# Patient Record
Sex: Female | Born: 1960 | Race: Black or African American | Hispanic: No | Smoking: Never smoker
Health system: Southern US, Community
[De-identification: ages and names within clinical notes are randomized; demographics above are authoritative.]

## PROBLEM LIST (undated history)

## (undated) DIAGNOSIS — K219 Gastro-esophageal reflux disease without esophagitis: Secondary | ICD-10-CM

## (undated) DIAGNOSIS — G4730 Sleep apnea, unspecified: Secondary | ICD-10-CM

## (undated) DIAGNOSIS — F32A Depression, unspecified: Secondary | ICD-10-CM

## (undated) DIAGNOSIS — L02215 Cutaneous abscess of perineum: Secondary | ICD-10-CM

## (undated) DIAGNOSIS — Z5189 Encounter for other specified aftercare: Secondary | ICD-10-CM

## (undated) DIAGNOSIS — M1990 Unspecified osteoarthritis, unspecified site: Secondary | ICD-10-CM

## (undated) DIAGNOSIS — D649 Anemia, unspecified: Secondary | ICD-10-CM

## (undated) DIAGNOSIS — R011 Cardiac murmur, unspecified: Secondary | ICD-10-CM

## (undated) DIAGNOSIS — IMO0001 Reserved for inherently not codable concepts without codable children: Secondary | ICD-10-CM

## (undated) DIAGNOSIS — E669 Obesity, unspecified: Secondary | ICD-10-CM

## (undated) DIAGNOSIS — F329 Major depressive disorder, single episode, unspecified: Secondary | ICD-10-CM

## (undated) HISTORY — DX: Cutaneous abscess of perineum: L02.215

## (undated) HISTORY — PX: TUBAL LIGATION: SHX77

## (undated) HISTORY — PX: OTHER SURGICAL HISTORY: SHX169

## (undated) HISTORY — DX: Obesity, unspecified: E66.9

## (undated) HISTORY — PX: ACHILLES TENDON SURGERY: SHX542

## (undated) HISTORY — PX: DILATION AND CURETTAGE OF UTERUS: SHX78

## (undated) HISTORY — DX: Anemia, unspecified: D64.9

## (undated) HISTORY — DX: Unspecified osteoarthritis, unspecified site: M19.90

---

## 2001-03-28 HISTORY — PX: GASTRIC BYPASS: SHX52

## 2005-11-05 ENCOUNTER — Emergency Department (HOSPITAL_COMMUNITY): Admission: EM | Admit: 2005-11-05 | Discharge: 2005-11-05 | Payer: Self-pay | Admitting: Family Medicine

## 2006-05-18 ENCOUNTER — Ambulatory Visit (HOSPITAL_COMMUNITY): Admission: RE | Admit: 2006-05-18 | Discharge: 2006-05-18 | Payer: Self-pay | Admitting: Family Medicine

## 2007-06-11 ENCOUNTER — Ambulatory Visit (HOSPITAL_COMMUNITY): Admission: RE | Admit: 2007-06-11 | Discharge: 2007-06-11 | Payer: Self-pay | Admitting: Obstetrics & Gynecology

## 2008-06-13 ENCOUNTER — Ambulatory Visit (HOSPITAL_COMMUNITY): Admission: RE | Admit: 2008-06-13 | Discharge: 2008-06-13 | Payer: Self-pay | Admitting: Obstetrics & Gynecology

## 2008-07-09 ENCOUNTER — Other Ambulatory Visit: Admission: RE | Admit: 2008-07-09 | Discharge: 2008-07-09 | Payer: Self-pay | Admitting: Family Medicine

## 2009-04-15 ENCOUNTER — Emergency Department (HOSPITAL_COMMUNITY): Admission: EM | Admit: 2009-04-15 | Discharge: 2009-04-15 | Payer: Self-pay | Admitting: Emergency Medicine

## 2009-04-16 ENCOUNTER — Ambulatory Visit (HOSPITAL_COMMUNITY): Admission: RE | Admit: 2009-04-16 | Discharge: 2009-04-16 | Payer: Self-pay | Admitting: Emergency Medicine

## 2009-06-16 ENCOUNTER — Ambulatory Visit (HOSPITAL_COMMUNITY): Admission: RE | Admit: 2009-06-16 | Discharge: 2009-06-16 | Payer: Self-pay | Admitting: Family Medicine

## 2010-05-13 ENCOUNTER — Other Ambulatory Visit: Payer: Self-pay | Admitting: Obstetrics & Gynecology

## 2010-05-13 DIAGNOSIS — Z1231 Encounter for screening mammogram for malignant neoplasm of breast: Secondary | ICD-10-CM

## 2010-05-21 ENCOUNTER — Encounter (HOSPITAL_COMMUNITY)
Admission: RE | Admit: 2010-05-21 | Discharge: 2010-05-21 | Disposition: A | Discharge status: Home / Self Care | Payer: BC Managed Care – PPO | Source: Ambulatory Visit | Attending: Obstetrics & Gynecology | Admitting: Obstetrics & Gynecology

## 2010-05-21 LAB — CBC
HCT: 33.6 % — ABNORMAL LOW (ref 36.0–46.0)
Hemoglobin: 10.4 g/dL — ABNORMAL LOW (ref 12.0–15.0)
MCV: 85.9 fL (ref 78.0–100.0)

## 2010-05-28 ENCOUNTER — Other Ambulatory Visit: Payer: Self-pay | Admitting: Obstetrics & Gynecology

## 2010-05-28 ENCOUNTER — Ambulatory Visit (HOSPITAL_COMMUNITY)
Admission: RE | Admit: 2010-05-28 | Discharge: 2010-05-28 | Disposition: A | Discharge status: Home / Self Care | Payer: BC Managed Care – PPO | Source: Ambulatory Visit | Attending: Obstetrics & Gynecology | Admitting: Obstetrics & Gynecology

## 2010-05-28 DIAGNOSIS — Q5128 Other doubling of uterus, other specified: Secondary | ICD-10-CM | POA: Insufficient documentation

## 2010-05-28 DIAGNOSIS — Z01812 Encounter for preprocedural laboratory examination: Secondary | ICD-10-CM | POA: Insufficient documentation

## 2010-05-28 DIAGNOSIS — N938 Other specified abnormal uterine and vaginal bleeding: Secondary | ICD-10-CM | POA: Insufficient documentation

## 2010-05-28 DIAGNOSIS — Z01818 Encounter for other preprocedural examination: Secondary | ICD-10-CM | POA: Insufficient documentation

## 2010-05-28 DIAGNOSIS — N949 Unspecified condition associated with female genital organs and menstrual cycle: Secondary | ICD-10-CM | POA: Insufficient documentation

## 2010-06-01 NOTE — Op Note (Addendum)
NAMEKRISTIANNE, Erika Bennett                  ACCOUNT NO.:  [id number]  MEDICAL RECORD NO.:  [id number]           PATIENT TYPE:  O  LOCATION:  [facility]                          FACILITY:  [facility]  PHYSICIAN:  Roseanna Rainbow, M.D.DATE OF BIRTH:  1960/11/10  DATE OF PROCEDURE:  05/28/2010 DATE OF DISCHARGE:                              OPERATIVE REPORT   PREOPERATIVE DIAGNOSES:  Abnormal uterine bleeding, uterine fibroids.  POSTOPERATIVE DIAGNOSES:  Abnormal uterine bleeding, uterine septum.  PROCEDURES:  Diagnostic hysteroscopy and dilatation and curettage.  SURGEON:  Roseanna Rainbow, MD  ANESTHESIA:  Laryngeal mask airway, paracervical block.  FINDINGS:  At hysteroscopy, the endometrium was somewhat lush appearing. There was a midline broad thick septum extending from the uterine fundus down to the lower uterine segments right above the internal cervical os. All specimens sent to Pathology.  ESTIMATED BLOOD LOSS:  Minimal. COMPLICATIONS:  None.  PROCEDURE:  The patient was taken to the operating room with an IV running.  A laryngeal mask airway was then placed.  She was placed in a semi-lithotomy position in [place] stirrups and prepped and draped in the usual sterile fashion.  After time-out had been completed, a Graves speculum was placed in the patient's vagina.  A paracervical block was then administered using 2% lidocaine along with 6 mL of dilute Pitressin solution.  Please note that, the single-tooth tenaculum had been applied to the anterior lip of the cervix.  The cervix was then dilated with [facility] dilators.  The hysteroscope was then advanced into the uterine cavity with the above findings noted.  A sharp curettage was then performed.  The tenaculum was then removed with minimal bleeding noted from the cervix.  At the close of the procedure, the instrument and pack counts were said to be correct x2.  The patient was taken to the PACU awake and in stable  condition.     Roseanna Rainbow, M.D.     Erika Bennett  D:  05/28/2010  T:  05/29/2010  Job:  [id number]  Electronically Signed by Antionette Char M.D. on 06/01/2010 11:18:07 AM Electronically Signed by Antionette Char M.D. on 06/01/2010 11:25:54 AM Electronically Signed by Antionette Char M.D. on 06/01/2010 11:39:06 AM Electronically Signed by Antionette Char M.D. on 06/01/2010 11:52:29 AM Electronically Signed by Antionette Char M.D. on 06/01/2010 12:07:08 PM Electronically Signed by Antionette Char M.D. on 06/01/2010 12:22:28 PM Electronically Signed by Antionette Char M.D. on 06/01/2010 12:38:30 PM Electronically Signed by Antionette Char M.D. on 06/01/2010 12:56:35 PM Electronically Signed by Antionette Char M.D. on 06/01/2010 01:14:52 PM Electronically Signed by Antionette Char M.D. on 06/01/2010 01:36:45 PM Electronically Signed by Antionette Char M.D. on 06/01/2010 01:56:53 PM Electronically Signed by Antionette Char M.D. on 06/01/2010 02:16:51 PM Electronically Signed by Antionette Char M.D. on 06/01/2010 02:39:01 PM Electronically Signed by Antionette Char M.D. on 06/01/2010 03:02:13 PM Electronically Signed by Antionette Char M.D. on 06/01/2010 03:27:09 PM Electronically Signed by Antionette Char M.D. on 06/01/2010 03:52:49 PM Electronically Signed by Antionette Char M.D. on 06/01/2010 04:00:38 PM Electronically Signed by Antionette Char M.D. on 06/01/2010 04:31:41 PM Electronically Signed by Antionette Char  M.D. on 06/01/2010 05:03:06 PM Electronically Signed by Antionette Char M.D. on 06/01/2010 05:03:06 PM Electronically Signed by Antionette Char M.D. on 06/01/2010 05:29:46 PM Electronically Signed by Antionette Char M.D. on 06/01/2010 05:29:46 PM Electronically Signed by Antionette Char M.D. on 06/01/2010 06:11:58 PM Electronically Signed by Antionette Char M.D. on 06/01/2010 06:20:20  PM Electronically Signed by Antionette Char M.D. on 06/01/2010 07:43:20 PM

## 2010-06-02 ENCOUNTER — Other Ambulatory Visit: Payer: Self-pay | Admitting: Family Medicine

## 2010-06-02 ENCOUNTER — Ambulatory Visit
Admission: RE | Admit: 2010-06-02 | Discharge: 2010-06-02 | Disposition: A | Discharge status: Home / Self Care | Payer: BC Managed Care – PPO | Source: Ambulatory Visit | Attending: Family Medicine | Admitting: Family Medicine

## 2010-06-02 DIAGNOSIS — R0602 Shortness of breath: Secondary | ICD-10-CM

## 2010-06-02 MED ORDER — IOHEXOL 300 MG/ML  SOLN
125.0000 mL | Freq: Once | INTRAMUSCULAR | Status: AC | PRN
  Administered 2010-06-02: 125 mL via INTRAVENOUS

## 2010-06-14 LAB — URINE MICROSCOPIC-ADD ON

## 2010-06-14 LAB — COMPREHENSIVE METABOLIC PANEL
ALT: 12 U/L (ref 0–35)
AST: 15 U/L (ref 0–37)
Albumin: 3.4 g/dL — ABNORMAL LOW (ref 3.5–5.2)
Calcium: 9.2 mg/dL (ref 8.4–10.5)
GFR calc non Af Amer: >60 mL/min (ref >60)
Total Bilirubin: 0.3 mg/dL (ref 0.3–1.2)
Total Protein: 6.8 g/dL (ref 6.0–8.3)

## 2010-06-14 LAB — URINALYSIS, ROUTINE W REFLEX MICROSCOPIC
Bilirubin Urine: NEGATIVE
Glucose, UA: 100 mg/dL — AB
Leukocytes, UA: NEGATIVE
Nitrite: NEGATIVE
Protein, ur: NEGATIVE mg/dL
Urobilinogen, UA: 1 mg/dL (ref 0.0–1.0)
pH: 5.5 (ref 5.0–8.0)

## 2010-06-14 LAB — CBC
MCV: 95 fL (ref 78.0–100.0)
Platelets: 369 10*3/uL (ref 150–400)
RDW: 13.4 % (ref 11.5–15.5)

## 2010-06-14 LAB — DIFFERENTIAL
Eosinophils Relative: 1 % (ref 0–5)
Lymphocytes Relative: 24 % (ref 12–46)
Monocytes Absolute: 1.1 10*3/uL — ABNORMAL HIGH (ref 0.1–1.0)

## 2010-06-14 LAB — POCT PREGNANCY, URINE: Preg Test, Ur: NEGATIVE

## 2010-06-14 LAB — POCT CARDIAC MARKERS: CKMB, poc: <1 ng/mL — ABNORMAL LOW (ref 1.0–8.0)

## 2010-06-18 ENCOUNTER — Ambulatory Visit (HOSPITAL_COMMUNITY)
Admission: RE | Admit: 2010-06-18 | Discharge: 2010-06-18 | Disposition: A | Discharge status: Home / Self Care | Payer: BC Managed Care – PPO | Source: Ambulatory Visit | Attending: Obstetrics & Gynecology | Admitting: Obstetrics & Gynecology

## 2010-06-18 DIAGNOSIS — Z1231 Encounter for screening mammogram for malignant neoplasm of breast: Secondary | ICD-10-CM

## 2010-09-10 ENCOUNTER — Observation Stay (HOSPITAL_COMMUNITY)
Admission: RE | Admit: 2010-09-10 | Discharge: 2010-09-11 | Disposition: A | Discharge status: Home / Self Care | Payer: BC Managed Care – PPO | Source: Ambulatory Visit | Attending: General Surgery | Admitting: General Surgery

## 2010-09-10 DIAGNOSIS — Z01812 Encounter for preprocedural laboratory examination: Secondary | ICD-10-CM | POA: Insufficient documentation

## 2010-09-10 DIAGNOSIS — Z9884 Bariatric surgery status: Secondary | ICD-10-CM | POA: Insufficient documentation

## 2010-09-10 DIAGNOSIS — Z79899 Other long term (current) drug therapy: Secondary | ICD-10-CM | POA: Insufficient documentation

## 2010-09-10 DIAGNOSIS — K612 Anorectal abscess: Principal | ICD-10-CM | POA: Insufficient documentation

## 2010-09-10 LAB — CBC
HCT: 30.6 % — ABNORMAL LOW (ref 36.0–46.0)
Hemoglobin: 10.5 g/dL — ABNORMAL LOW (ref 12.0–15.0)
Hemoglobin: 9.8 g/dL — ABNORMAL LOW (ref 12.0–15.0)
MCH: 26.3 pg (ref 26.0–34.0)
MCHC: 31.8 g/dL (ref 30.0–36.0)
MCV: 82.3 fL (ref 78.0–100.0)
RBC: 3.72 MIL/uL — ABNORMAL LOW (ref 3.87–5.11)
RBC: 3.99 MIL/uL (ref 3.87–5.11)
WBC: 12.9 10*3/uL — ABNORMAL HIGH (ref 4.0–10.5)
WBC: 15.1 10*3/uL — ABNORMAL HIGH (ref 4.0–10.5)

## 2010-09-10 LAB — BASIC METABOLIC PANEL
BUN: 15 mg/dL (ref 6–23)
CO2: 25 mEq/L (ref 19–32)
Calcium: 9.6 mg/dL (ref 8.4–10.5)

## 2010-09-10 LAB — DIFFERENTIAL
Eosinophils Relative: 1 % (ref 0–5)
Lymphocytes Relative: 15 % (ref 12–46)
Lymphs Abs: 1.9 10*3/uL (ref 0.7–4.0)
Neutro Abs: 10 10*3/uL — ABNORMAL HIGH (ref 1.7–7.7)

## 2010-09-10 LAB — SURGICAL PCR SCREEN: Staphylococcus aureus: NEGATIVE

## 2010-09-11 LAB — BASIC METABOLIC PANEL
BUN: 13 mg/dL (ref 6–23)
CO2: 25 mEq/L (ref 19–32)
Chloride: 104 mEq/L (ref 96–112)
Creatinine, Ser: 0.68 mg/dL (ref 0.50–1.10)
Glucose, Bld: 195 mg/dL — ABNORMAL HIGH (ref 70–99)

## 2010-09-12 NOTE — Op Note (Signed)
  NAMEJANECIA, Erika Bennett                  ACCOUNT NO.:  [id number]  MEDICAL RECORD NO.:  [id number]  LOCATION:  1523                         FACILITY:  [facility]  PHYSICIAN:  Angelia Mould. Derrell Lolling, M.D.DATE OF BIRTH:  [birth year]  DATE OF PROCEDURE:  09/10/2010 DATE OF DISCHARGE:                              OPERATIVE REPORT   PREOPERATIVE DIAGNOSIS:  Perirectal abscess, right anterior.  POSTOPERATIVE DIAGNOSIS:  Ischiorectal abscess, right anterior.  OPERATION PERFORMED:  Anoscopy, exam under anesthesia,  incision and drainage of ischiorectal abscess, right anterior.  SURGEON:  Angelia Mould. Derrell Lolling, MD  OPERATIVE INDICATIONS:  This is a 50 year old African American female who presented today with a 1-week history of progressive swelling and pain in the perianal area on the right side.  It has begun to drain. She was seen in the office by Dr. Emelia Loron, but he transferred her to [facility] to my care for incision and drainage ofperirectal abscess under general anesthesia.  On exam, she has a large 4- 5 cm abscess in the right anterior position and she is brought to operating room urgently.  OPERATIVE TECHNIQUE:  Following the induction of general endotracheal anesthesia, the patient was placed in a dorsal lithotomy position in rigid, padded stirrups.  Surgical time-out was held identifying correct patient and correct procedure.  Intravenous antibiotics were given prior to the procedure.  I examined the perianal and vulvar areas.  There was about a 4-5 cm abscess in the perianal area in the right anterior position.  The posterior lateral walls of the vagina did not appear to have any abnormality whatsoever.  Digital rectal exam revealed no stricture. Anoscopy revealed no fistula internally.  I made a radially oriented elliptical incision excising the ellipse of skin and drained the abscess completely.  I digitally explored this to make sure it was completely evacuated.  I  re-inserted the anoscope and looked for a fistula with a probe and could not find any.  Hemostasis was good and achieved with electrocautery.  After irrigating things out, I packed the wound with 1.5-inch gauze and external absorbent bandages.  She tolerated procedure well and was taken to recovery room in stable condition.  Estimated blood loss was about 20 cc.  Complications none. Sponge, needle, and instrument counts were correct.     Angelia Mould. Derrell Lolling, M.D.    HMI/MEDQ  D:  09/10/2010  T:  09/11/2010  Job:  [id number]  Electronically Signed by Claud Kelp M.D. on 09/12/2010 11:41:25 AM

## 2010-09-13 NOTE — H&P (Signed)
  NAMELOREENA, VALERI                  ACCOUNT NO.:  [id number]  MEDICAL RECORD NO.:  [id number]  LOCATION:  [facility]                         FACILITY:  [facility]  PHYSICIAN:  Juanetta Gosling, MDDATE OF BIRTH:  [birth year]  DATE OF ADMISSION:  09/10/2010 DATE OF DISCHARGE:                             HISTORY & PHYSICAL   CHIEF COMPLAINT:  Perrectal pain and drainage.  HISTORY OF PRESENT ILLNESS:  This is a 50 year old female who has perirectal mass and pain for about 1 week.  She had some constipation. Did some straining and she had what appeared to be a hemorrhoid popup following that.  Over the time, just began draining purulent fluid and is worsened over the time.  She has attempted some sitz baths without any real relief of this pain.  She denies any fever.  She finally came in today after been evaluated by gastroenterology office and was sent up here.  PAST MEDICAL HISTORY:  Significant for morbid obesity.  PAST SURGICAL HISTORY:  Significant for gastric bypass and bilateral tubal ligation.  FAMILY HISTORY:  Noncontributory.  SOCIAL HISTORY:  She is nonsmoker and does not smoke.  No alcohol.  ALLERGIES:  She has no known drug allergies.  MEDICATIONS:  Tylenol p.r.n.  REVIEW OF SYSTEMS:  Otherwise negative.  PHYSICAL EXAMINATION:  VITAL SIGNS:  Temperature 97.8, heart rate 78, blood pressure 142/84, weight 220 pounds, height 5 feet 5 inch. GENERAL:  She is a morbidly obese, in no distress. NECK:  Supple. HEART:  Regular rate and rhythm. LUNGS:  Clear bilaterally. ABDOMEN:  Soft and obese. RECTAL:  Examination is difficult due to her body habitus.  She has a right anterior fluctuant perirectal abscess that is draining purulent fluid.  ASSESSMENT:  Perirectal abscess.  PLAN:  Incision and drainage in the Operating Room.  I can adequately do this in the office today.  I have discussed with Dr. Claud Kelp who will plan on taking care of her in the Operating  Room.     Juanetta Gosling, MD     MCW/MEDQ  D:  09/10/2010  T:  09/10/2010  Job:  [id number]  Electronically Signed by Emelia Loron MD on 09/13/2010 04:22:05 PM

## 2010-09-23 NOTE — Discharge Summary (Signed)
  Erika Bennett, Erika Bennett                  ACCOUNT NO.:  [id number]  MEDICAL RECORD NO.:  [id number]  LOCATION:  1523                         FACILITY:  [facility]  PHYSICIAN:  Angelia Mould. Derrell Lolling, M.D.DATE OF BIRTH:  [birth year]  DATE OF ADMISSION:  09/10/2010 DATE OF DISCHARGE:  09/11/2010                              DISCHARGE SUMMARY   FINAL DIAGNOSES: 1. Ischiorectal abscess, right anterior 2. Morbid obesity. 3. Status post Roux-en-Y gastric bypass. 4. Status post bilateral tubal ligation.  OPERATIONS PERFORMED:  Anoscopy, incision and drainage of ischiorectal abscess, right anterior; date of surgery September 10, 2010.  HISTORY:  This is a 50 year old African-American female who presented to the urgent office with a 1-week history of progressive swelling and pain in the perianal area on the right side.  It had begun to drain.  She was evaluated by Dr. Emelia Loron and transferred to [facility] to my care.  Upon admission to the hospital and exam, she was found to have at least a 4 cm to 5 cm abscess in the right anterior position and she was advised to be admitted to the hospital and taken to operating room for drainage.  She completely agreed with this.  HOSPITAL COURSE:  On the day of admission, the patient was taken to operating room.  Under general anesthesia in lithotomy position, she underwent incision and drainage of perirectal abscess in the right anterior position.  This was completely drained and the wound left open. I could not find a fistula.  Postoperatively, the patient did very well.  The following morning, September 11, 2010, she was much more comfortable, had much less pain, did not have any significant bleeding, was up to the bathroom, able to void tolerating the diet, afebrile with a heart rate 77.  The wound looked fine.  There was no active bleeding.  She was discharged on September 11, 2010.  She was given a prescription for Augmentin 875 mg p.o. b.i.d. x7 days  and Vicodin for pain.  She was asked to return to see me in the office in 3 to 4 weeks.  Wound care was discussed and written.     Angelia Mould. Derrell Lolling, M.D.     HMI/MEDQ  D:  09/22/2010  T:  09/22/2010  Job:  [id number]  cc:   Angelia Mould. Derrell Lolling, M.D. [address] [place] [address]  Electronically Signed by Claud Kelp M.D. on 09/23/2010 02:16:17 PM

## 2010-10-04 ENCOUNTER — Encounter (INDEPENDENT_AMBULATORY_CARE_PROVIDER_SITE_OTHER): Payer: Self-pay | Admitting: General Surgery

## 2010-10-11 ENCOUNTER — Encounter (INDEPENDENT_AMBULATORY_CARE_PROVIDER_SITE_OTHER): Payer: Self-pay | Admitting: General Surgery

## 2010-10-11 ENCOUNTER — Ambulatory Visit (INDEPENDENT_AMBULATORY_CARE_PROVIDER_SITE_OTHER): Payer: BC Managed Care – PPO | Admitting: General Surgery

## 2010-10-11 DIAGNOSIS — Z09 Encounter for follow-up examination after completed treatment for conditions other than malignant neoplasm: Secondary | ICD-10-CM

## 2010-10-11 NOTE — Patient Instructions (Signed)
Your rectal incision appears to be healing very nicely. Continue to keep the skin clean. Continue to use baby wipes. Avoid constipation and take daily stool softeners if necessary. I expect that the wound should completely heal within one month and that there should be no more pain or drainage at that point. Please call me if there are any problems after that time.

## 2010-10-11 NOTE — Progress Notes (Signed)
Subjective:     Patient ID: Erika Bennett, female   DOB: Aug 01, 1960, 50 y.o.   MRN: [id number]  HPI Patient underwent incision and drainage of perirectal abscess September 10, 2010. This was a fairly large abscess in the right anterior position. She has done well since discharge. She still has a little bit of drainage and is still wearing a pad she sees no blood and has almost no leading. She's having normal bowel movements, is not constipated.  Review of Systems     Objective:   Physical Exam The perianal tissues looked very good. The wound has almost completely healed and only is a small open slit of tissue. There is no purulence. There is no cellulitis. It is minimally tender.    Assessment:     Perirectal abscess,status post incision and drainage, healing rapidly and uneventfully.    Plan:     Continue wound care and perianal hygiene as instructed. Return to see me if it has not completely healed in one month. Otherwise see me p.r.n.

## 2010-10-28 ENCOUNTER — Other Ambulatory Visit: Payer: Self-pay | Admitting: Obstetrics & Gynecology

## 2010-10-28 DIAGNOSIS — Z30431 Encounter for routine checking of intrauterine contraceptive device: Secondary | ICD-10-CM

## 2010-10-28 DIAGNOSIS — Q649 Congenital malformation of urinary system, unspecified: Secondary | ICD-10-CM

## 2010-11-02 ENCOUNTER — Ambulatory Visit (HOSPITAL_COMMUNITY): Payer: BC Managed Care – PPO

## 2010-11-03 ENCOUNTER — Ambulatory Visit (HOSPITAL_COMMUNITY)
Admission: RE | Admit: 2010-11-03 | Discharge: 2010-11-03 | Disposition: A | Discharge status: Home / Self Care | Payer: BC Managed Care – PPO | Source: Ambulatory Visit | Attending: Obstetrics & Gynecology | Admitting: Obstetrics & Gynecology

## 2010-11-03 DIAGNOSIS — Q649 Congenital malformation of urinary system, unspecified: Secondary | ICD-10-CM

## 2010-11-03 DIAGNOSIS — Q519 Congenital malformation of uterus and cervix, unspecified: Secondary | ICD-10-CM | POA: Insufficient documentation

## 2010-11-03 DIAGNOSIS — Q513 Bicornate uterus: Secondary | ICD-10-CM | POA: Insufficient documentation

## 2010-11-03 MED ORDER — IOHEXOL 350 MG/ML SOLN
100.0000 mL | Freq: Once | INTRAVENOUS | Status: AC | PRN
  Administered 2010-11-03: 100 mL via INTRAVENOUS

## 2011-05-18 ENCOUNTER — Other Ambulatory Visit: Payer: Self-pay | Admitting: Obstetrics & Gynecology

## 2011-05-19 ENCOUNTER — Other Ambulatory Visit: Payer: Self-pay | Admitting: Obstetrics & Gynecology

## 2011-05-19 DIAGNOSIS — N898 Other specified noninflammatory disorders of vagina: Secondary | ICD-10-CM

## 2011-05-24 ENCOUNTER — Ambulatory Visit (HOSPITAL_COMMUNITY)
Admission: RE | Admit: 2011-05-24 | Discharge: 2011-05-24 | Disposition: A | Discharge status: Home / Self Care | Payer: BC Managed Care – PPO | Source: Ambulatory Visit | Attending: Obstetrics & Gynecology | Admitting: Obstetrics & Gynecology

## 2011-05-24 DIAGNOSIS — N926 Irregular menstruation, unspecified: Secondary | ICD-10-CM | POA: Insufficient documentation

## 2011-05-24 DIAGNOSIS — D250 Submucous leiomyoma of uterus: Secondary | ICD-10-CM | POA: Insufficient documentation

## 2011-05-24 DIAGNOSIS — N898 Other specified noninflammatory disorders of vagina: Secondary | ICD-10-CM | POA: Insufficient documentation

## 2011-06-05 ENCOUNTER — Inpatient Hospital Stay (HOSPITAL_COMMUNITY)
Admission: AD | Admit: 2011-06-05 | Discharge: 2011-06-05 | Disposition: A | Discharge status: Home / Self Care | Payer: BC Managed Care – PPO | Source: Ambulatory Visit | Attending: Obstetrics & Gynecology | Admitting: Obstetrics & Gynecology

## 2011-06-05 ENCOUNTER — Encounter (HOSPITAL_COMMUNITY): Payer: Self-pay | Admitting: Obstetrics and Gynecology

## 2011-06-05 DIAGNOSIS — N949 Unspecified condition associated with female genital organs and menstrual cycle: Secondary | ICD-10-CM | POA: Insufficient documentation

## 2011-06-05 DIAGNOSIS — N939 Abnormal uterine and vaginal bleeding, unspecified: Secondary | ICD-10-CM

## 2011-06-05 DIAGNOSIS — N938 Other specified abnormal uterine and vaginal bleeding: Secondary | ICD-10-CM | POA: Insufficient documentation

## 2011-06-05 DIAGNOSIS — N898 Other specified noninflammatory disorders of vagina: Secondary | ICD-10-CM

## 2011-06-05 LAB — CBC
HCT: 35.1 % — ABNORMAL LOW (ref 36.0–46.0)
Hemoglobin: 11.3 g/dL — ABNORMAL LOW (ref 12.0–15.0)
MCH: 29.2 pg (ref 26.0–34.0)
MCHC: 32.2 g/dL (ref 30.0–36.0)
MCV: 90.7 fL (ref 78.0–100.0)
Platelets: 319 10*3/uL (ref 150–400)
RBC: 3.87 MIL/uL (ref 3.87–5.11)
RDW: 20.8 % — ABNORMAL HIGH (ref 11.5–15.5)
WBC: 10.6 10*3/uL — ABNORMAL HIGH (ref 4.0–10.5)

## 2011-06-05 LAB — SAMPLE TO BLOOD BANK

## 2011-06-05 MED ORDER — NAPROXEN SODIUM 550 MG PO TABS: 550.0000 mg | ORAL_TABLET | Freq: Two times a day (BID) | ORAL | Status: DC

## 2011-06-05 MED ORDER — KETOROLAC TROMETHAMINE 30 MG/ML IJ SOLN
30.0000 mg | Freq: Once | INTRAMUSCULAR | Status: DC
Start: 2011-06-05 — End: 2011-06-05

## 2011-06-05 MED ORDER — MEGESTROL ACETATE 40 MG PO TABS: ORAL_TABLET | ORAL | Status: DC

## 2011-06-05 NOTE — Discharge Instructions (Signed)

## 2011-06-05 NOTE — ED Provider Notes (Signed)
History     CSN: [id number]  Arrival date & time 06/05/11  1513   None     Chief Complaint  Patient presents with  . Vaginal Bleeding    HPI Erika Bennett is a 51 y.o. G3P3, LMP 2/24 who presets with heavy bleeding with clots and cramping. She has long hx abnormal bleeding, has had D&C, ablation, OCP's that have not been effective. She placed Nuvaring 3/7, bleeding finally stopped, she had severe cramping so removed it 1 day later, bleeding started again 3/9. She had U/S 2/28- 1.8 cm fundal fibroid, .9 left anterior fibroid, both with submucosal component. Her bleeding became heavy at 3 am, she is changing a tampon + pad every 1 1/2-2 hrs, passing 2-3 cm clots and having severe cramping.  Last OV with Dr Tamela Oddi was last week, no f/u appt yet.  Past Medical History  Diagnosis Date  . Obesity   . Arthritis   . Anemia   . Abscess of perineum     Past Surgical History  Procedure Date  . Gastric bypass   . Tubal ligation   . Peri rectal   . Abscess peri rectal     I&d  . Dilation and curettage of uterus     Family History  Problem Relation Age of Onset  . Heart disease Father     heart attacks  . Cancer Brother     prostate    History  Substance Use Topics  . Smoking status: Never Smoker   . Smokeless tobacco: Not on file  . Alcohol Use: No    OB History    Grav Para Term Preterm Abortions TAB SAB Ect Mult Living   3 3        3       Review of Systems  Constitutional: Positive for fatigue. Negative for fever and chills.  Genitourinary: Positive for vaginal bleeding. Negative for dysuria, urgency, frequency, vaginal discharge and vaginal pain.  Neurological: Negative for dizziness, syncope and weakness.    Allergies  Review of patient's allergies indicates no known allergies.  Home Medications  No current outpatient prescriptions on file.  BP 149/59  Pulse 74  Temp(Src) 99.9 F (37.7 C) (Oral)  Resp 18  Ht 5\' 4"  (1.626 m)  Wt 318 lb 3.2 oz  (144.335 kg)  BMI 54.62 kg/m2  LMP 05/22/2011  Physical Exam  Constitutional: She appears well-developed and well-nourished.  Abdominal: Soft. There is no tenderness. There is no rebound and no guarding.       obese  Genitourinary:       External gen- nl Vagina- small amt bright red blood, no clots, no cyst seen or palpated cx-parous  UT- sl enlarged Adn- no masses spalp, non tender  Musculoskeletal: Normal range of motion.  Neurological: She is alert.  Skin: Skin is warm and dry.  Psychiatric: She has a normal mood and affect. Her behavior is normal.    ED Course  Procedures (including critical care time)   Labs Reviewed  URINALYSIS, ROUTINE W REFLEX MICROSCOPIC   Results for orders placed during the hospital encounter of 06/05/11 (from the past 24 hour(s))  CBC     Status: Abnormal   Collection Time   06/05/11  4:40 PM      Component Value Range   WBC 10.6 (*) 4.0 - 10.5 (K/uL)   RBC 3.87  3.87 - 5.11 (MIL/uL)   Hemoglobin 11.3 (*) 12.0 - 15.0 (g/dL)   HCT 08.6 (*) 57.8 -  46.0 (%)   MCV 90.7  78.0 - 100.0 (fL)   MCH 29.2  26.0 - 34.0 (pg)   MCHC 32.2  30.0 - 36.0 (g/dL)   RDW 16.1 (*) 09.6 - 15.5 (%)   Platelets 319  150 - 400 (K/uL)   ASSESSMENT:  Persistent vaginal bleeding  ASSESSMENT:  Megace 40 mg TID x 3 days, BID x 3 days then 1/day Pt to call the office and make an appt with Dr Tamela Oddi for f/u      MDM          Rodell Perna, NP 06/05/11 1734

## 2011-06-05 NOTE — Progress Notes (Signed)
Pt had u/s last week to evaluate a ovarian cyst. Had heavy bleeding with last period that started 2/24. Bleeding stopped on 3/7 and restarted on 3/9 and is heavy again passing large clots.

## 2011-06-08 ENCOUNTER — Other Ambulatory Visit: Payer: Self-pay | Admitting: Obstetrics & Gynecology

## 2011-07-04 ENCOUNTER — Other Ambulatory Visit: Payer: Self-pay | Admitting: Obstetrics & Gynecology

## 2011-07-04 DIAGNOSIS — Z1231 Encounter for screening mammogram for malignant neoplasm of breast: Secondary | ICD-10-CM

## 2011-07-11 ENCOUNTER — Encounter (HOSPITAL_COMMUNITY): Payer: Self-pay | Admitting: Pharmacy Technician

## 2011-07-12 ENCOUNTER — Encounter (HOSPITAL_COMMUNITY): Payer: Self-pay

## 2011-07-12 ENCOUNTER — Encounter (HOSPITAL_COMMUNITY)
Admission: RE | Admit: 2011-07-12 | Discharge: 2011-07-12 | Disposition: A | Discharge status: Home / Self Care | Payer: BC Managed Care – PPO | Source: Ambulatory Visit | Attending: Obstetrics & Gynecology | Admitting: Obstetrics & Gynecology

## 2011-07-12 HISTORY — DX: Encounter for other specified aftercare: Z51.89

## 2011-07-12 HISTORY — DX: Depression, unspecified: F32.A

## 2011-07-12 HISTORY — DX: Sleep apnea, unspecified: G47.30

## 2011-07-12 HISTORY — DX: Cardiac murmur, unspecified: R01.1

## 2011-07-12 HISTORY — DX: Reserved for inherently not codable concepts without codable children: IMO0001

## 2011-07-12 HISTORY — DX: Gastro-esophageal reflux disease without esophagitis: K21.9

## 2011-07-12 HISTORY — DX: Major depressive disorder, single episode, unspecified: F32.9

## 2011-07-12 LAB — CBC
HCT: 36.1 % (ref 36.0–46.0)
MCH: 29.4 pg (ref 26.0–34.0)
MCV: 90 fL (ref 78.0–100.0)
Platelets: 409 10*3/uL — ABNORMAL HIGH (ref 150–400)
RDW: 14.8 % (ref 11.5–15.5)

## 2011-07-12 LAB — BASIC METABOLIC PANEL
CO2: 26 mEq/L (ref 19–32)
Chloride: 103 mEq/L (ref 96–112)
Creatinine, Ser: 0.76 mg/dL (ref 0.50–1.10)
Glucose, Bld: 92 mg/dL (ref 70–99)

## 2011-07-12 NOTE — Patient Instructions (Signed)
20 Erika Bennett  07/12/2011   Your procedure is scheduled on:  07/22/11  Friday  Surgery [id number]  Report to [facility] at   0515    AM.  Call this number if you have problems the morning of surgery: [phone]     Or PST   [id number]  [facility]             Call office regarding stopping  Vitamins and antiinflammatories  Remember:  Do not eat food  or  Or drink liquids:After Midnight. Thursday NIGHT    Clear liquids include soda, tea, black coffee, apple or grape juice, broth.  Take these medicines the morning of surgery with A SIP OF WATER:none    MAY USE TYLENOL ARTHRITIS AM OF OR   Do not wear jewelry, make-up or nail polish.  Do not wear lotions, powders, or perfumes. You may wear deodorant.  Do not shave 48 hours prior to surgery.  Do not bring valuables to the hospital.  Contacts, dentures or bridgework may not be worn into surgery.  Leave suitcase in the car. After surgery it may be brought to your room.  For patients admitted to the hospital, checkout time is 11:00 AM the day of discharge.   Patients discharged the day of surgery will not be allowed to drive home.  Name and phone number of your driver: Erika Bennett                                                                     Special Instructions: CHG Shower Use Special Wash: 1/2 bottle night before surgery and 1/2 bottle morning of surgery. REGULAR SOAP FACE AND PRIVATES              LADIES- NO SHAVING 48 HOURS BEFORE USING BETASEPT SOAP.                 Please read over the following fact sheets that you were given: MRSA Information

## 2011-07-12 NOTE — Pre-Procedure Instructions (Signed)
At PST visit- order noted from Dr Tamela Oddi that pregnancy test is not needed- pt has had a BTL and is currently bleeding

## 2011-07-12 NOTE — Pre-Procedure Instructions (Signed)
Left voice mail message on nurse line at Dr Lodema Pilot office regarding clarification of ambulatory surgery status vs outpatient in bed day of surgery

## 2011-07-18 ENCOUNTER — Encounter (HOSPITAL_COMMUNITY): Payer: Self-pay | Admitting: Obstetrics & Gynecology

## 2011-07-18 DIAGNOSIS — N926 Irregular menstruation, unspecified: Secondary | ICD-10-CM | POA: Diagnosis present

## 2011-07-18 NOTE — H&P (Signed)
  Subjective:  Erika Bennett is a 51 y.o. gravida 3 para 3, female.  The patient has a long h/o abnormal uterine bleeding.  Onset of symptoms was gradual starting several years ago with gradually worsening course since that time. Bleeding is characterized as heavy.   Evaluation to date: D & C: showed a uterine septum, hysteroscopy: see above and pelvic ultrasound: positive for uterine fibroids. Treatment to date: endometrial ablation.  Pertinent Gyn History: See above Menses heavy/irregular Bleeding: see above Contraception: tubal ligation Preventive screening:  Last pap: abnormal: LSIL; colposcopy negative Date: 1/13  Patient Active Problem List  Diagnoses Date Noted  . Irregular menstrual cycle 07/18/2011   Past Medical History  Diagnosis Date  . Obesity   . Arthritis   . Abscess of perineum   . Heart murmur   . Anemia     states bleeding vaginally since 2/13  . Blood transfusion     age 58 months  . GERD (gastroesophageal reflux disease)   . Sleep apnea     last used CPAP 09-18-02  . Depression     controlled/ states death of husband 17-Sep-2009    Past Surgical History  Procedure Date  . Tubal ligation   . Peri rectal   . Abscess peri rectal     I&d  . Dilation and curettage of uterus   . Gastric bypass 09-17-01    No prescriptions prior to admission   No Known Allergies  History  Substance Use Topics  . Smoking status: Never Smoker   . Smokeless tobacco: Never Used  . Alcohol Use: No     2-3 drinks week    Family History  Problem Relation Age of Onset  . Heart disease Father     heart attacks  . Cancer Brother     prostate     Review of Systems Pertinent items are noted in HPI.    Objective:   Vital signs in last 24 hours:    General:   alert  Skin:   normal  HEENT:  PERRLA  Lungs:   clear to auscultation bilaterally  Heart:   regular rate and rhythm, S1, S2 normal, no murmur, click, rub or gallop  Breasts:   normal without suspicious masses, skin or nipple  changes or axillary nodes  Abdomen:  soft, non-tender; bowel sounds normal; no masses,  no organomegaly  Pelvis:  External genitalia: normal general appearance Vaginal: normal mucosa without prolapse or lesions Cervix: normal appearance Adnexa: normal bimanual exam Uterus: enlarged                                     Assessment/Plan:  Perimenopausal with continued, irregular bleeding. Small fibroid uterus.    I had a lengthy discussion with the patient regarding her bleeding and consideration for a robotic-assis hysterectomy.  Procedure, risks, reasons, benefits and complications (including injury to bowel, bladder, major blood vessel, ureter, bleeding, possibility of transfusion, infection, or fistula formation) were reviewed in detail. Consent was signed and preop testing was ordered.  Instructions were reviewed, including NPO after midnight.

## 2011-07-21 NOTE — Anesthesia Preprocedure Evaluation (Addendum)
Anesthesia Evaluation  Patient identified by MRN, date of birth, ID band Patient awake    Reviewed: Allergy & Precautions, H&P , NPO status , Patient's Chart, lab work & pertinent test results  Airway Mallampati: III TM Distance: >3 FB Neck ROM: full    Dental No notable dental hx. (+) Teeth Intact and Dental Advisory Given   Pulmonary neg pulmonary ROS, sleep apnea ,  Last used CPAP 2004 breath sounds clear to auscultation  Pulmonary exam normal       Cardiovascular Exercise Tolerance: Good negative cardio ROS  Rhythm:regular Rate:Normal + Systolic murmurs    Neuro/Psych negative neurological ROS  negative psych ROS   GI/Hepatic negative GI ROS, Neg liver ROS, GERD-  ,  Endo/Other  negative endocrine ROSMorbid obesity  Renal/GU negative Renal ROS  negative genitourinary   Musculoskeletal   Abdominal   Peds  Hematology negative hematology ROS (+)   Anesthesia Other Findings   Reproductive/Obstetrics negative OB ROS                          Anesthesia Physical Anesthesia Plan  ASA: II  Anesthesia Plan: General   Post-op Pain Management:    Induction: Intravenous  Airway Management Planned: Oral ETT  Additional Equipment:   Intra-op Plan:   Post-operative Plan: Extubation in OR  Informed Consent: I have reviewed the patients History and Physical, chart, labs and discussed the procedure including the risks, benefits and alternatives for the proposed anesthesia with the patient or authorized representative who has indicated his/her understanding and acceptance.   Dental Advisory Given  Plan Discussed with: CRNA and Surgeon  Anesthesia Plan Comments:         Anesthesia Quick Evaluation

## 2011-07-22 ENCOUNTER — Ambulatory Visit (HOSPITAL_COMMUNITY): Payer: BC Managed Care – PPO | Admitting: Anesthesiology

## 2011-07-22 ENCOUNTER — Encounter (HOSPITAL_COMMUNITY): Payer: Self-pay | Admitting: *Deleted

## 2011-07-22 ENCOUNTER — Encounter (HOSPITAL_COMMUNITY): Payer: Self-pay | Admitting: Anesthesiology

## 2011-07-22 ENCOUNTER — Inpatient Hospital Stay (HOSPITAL_COMMUNITY)
Admission: RE | Admit: 2011-07-22 | Discharge: 2011-07-25 | DRG: 573 | Disposition: A | Discharge status: Home / Self Care | Payer: BC Managed Care – PPO | Source: Ambulatory Visit | Attending: Obstetrics & Gynecology | Admitting: Obstetrics & Gynecology

## 2011-07-22 ENCOUNTER — Encounter (HOSPITAL_COMMUNITY)
Admission: RE | Disposition: A | Discharge status: Home / Self Care | Payer: Self-pay | Source: Ambulatory Visit | Attending: Obstetrics & Gynecology

## 2011-07-22 DIAGNOSIS — IMO0002 Reserved for concepts with insufficient information to code with codable children: Secondary | ICD-10-CM | POA: Diagnosis not present

## 2011-07-22 DIAGNOSIS — M129 Arthropathy, unspecified: Secondary | ICD-10-CM | POA: Diagnosis present

## 2011-07-22 DIAGNOSIS — Y836 Removal of other organ (partial) (total) as the cause of abnormal reaction of the patient, or of later complication, without mention of misadventure at the time of the procedure: Secondary | ICD-10-CM | POA: Diagnosis not present

## 2011-07-22 DIAGNOSIS — K219 Gastro-esophageal reflux disease without esophagitis: Secondary | ICD-10-CM | POA: Diagnosis present

## 2011-07-22 DIAGNOSIS — Z5331 Laparoscopic surgical procedure converted to open procedure: Secondary | ICD-10-CM

## 2011-07-22 DIAGNOSIS — Z01812 Encounter for preprocedural laboratory examination: Secondary | ICD-10-CM

## 2011-07-22 DIAGNOSIS — D259 Leiomyoma of uterus, unspecified: Principal | ICD-10-CM | POA: Diagnosis present

## 2011-07-22 DIAGNOSIS — N926 Irregular menstruation, unspecified: Secondary | ICD-10-CM | POA: Diagnosis present

## 2011-07-22 HISTORY — PX: ABDOMINAL HYSTERECTOMY: SHX81

## 2011-07-22 LAB — CBC
Platelets: 290 10*3/uL (ref 150–400)
RBC: 3.43 MIL/uL — ABNORMAL LOW (ref 3.87–5.11)
RDW: 15 % (ref 11.5–15.5)
WBC: 13.6 10*3/uL — ABNORMAL HIGH (ref 4.0–10.5)

## 2011-07-22 LAB — BASIC METABOLIC PANEL
CO2: 24 mEq/L (ref 19–32)
Chloride: 107 mEq/L (ref 96–112)
GFR calc Af Amer: >90 mL/min (ref >90)
Sodium: 137 mEq/L (ref 135–145)

## 2011-07-22 LAB — ABO/RH: ABO/RH(D): O POS

## 2011-07-22 SURGERY — ROBOTIC ASSISTED TOTAL HYSTERECTOMY
Anesthesia: General | Wound class: Clean

## 2011-07-22 MED ORDER — HETASTARCH-ELECTROLYTES 6 % IV SOLN
INTRAVENOUS | Status: DC | PRN
  Administered 2011-07-22: 11:00:00 via INTRAVENOUS

## 2011-07-22 MED ORDER — CISATRACURIUM BESYLATE 2 MG/ML IV SOLN
INTRAVENOUS | Status: DC | PRN
  Administered 2011-07-22 (×2): 4 mg via INTRAVENOUS
  Administered 2011-07-22: 2 mg via INTRAVENOUS
  Administered 2011-07-22: 6 mg via INTRAVENOUS
  Administered 2011-07-22: 10 mg via INTRAVENOUS

## 2011-07-22 MED ORDER — PROPOFOL 10 MG/ML IV BOLUS
INTRAVENOUS | Status: DC | PRN
  Administered 2011-07-22: 200 mg via INTRAVENOUS

## 2011-07-22 MED ORDER — MAGNESIUM HYDROXIDE 400 MG/5ML PO SUSP
30.0000 mL | Freq: Three times a day (TID) | ORAL | Status: AC
  Administered 2011-07-22 – 2011-07-23 (×3): 30 mL via ORAL
  Filled 2011-07-22 (×3): qty 30

## 2011-07-22 MED ORDER — ONDANSETRON HCL 4 MG PO TABS: 4.0000 mg | ORAL_TABLET | Freq: Four times a day (QID) | ORAL | Status: DC | PRN

## 2011-07-22 MED ORDER — SODIUM CHLORIDE 0.9 % IJ SOLN: 9.0000 mL | INTRAMUSCULAR | Status: DC | PRN

## 2011-07-22 MED ORDER — LACTATED RINGERS IR SOLN
Status: DC | PRN
  Administered 2011-07-22: 100 mL

## 2011-07-22 MED ORDER — HYDROMORPHONE 0.3 MG/ML IV SOLN
INTRAVENOUS | Status: AC
  Filled 2011-07-22: qty 25

## 2011-07-22 MED ORDER — ACETAMINOPHEN 10 MG/ML IV SOLN
INTRAVENOUS | Status: AC
  Filled 2011-07-22: qty 100

## 2011-07-22 MED ORDER — ONDANSETRON HCL 4 MG/2ML IJ SOLN
INTRAMUSCULAR | Status: DC | PRN
  Administered 2011-07-22: 4 mg via INTRAVENOUS

## 2011-07-22 MED ORDER — OXYCODONE-ACETAMINOPHEN 5-325 MG PO TABS
1.0000 | ORAL_TABLET | ORAL | Status: DC | PRN
  Administered 2011-07-24 – 2011-07-25 (×6): 2 via ORAL
  Filled 2011-07-22 (×6): qty 2

## 2011-07-22 MED ORDER — HYDROMORPHONE 0.3 MG/ML IV SOLN
INTRAVENOUS | Status: DC
  Administered 2011-07-22: 0.3 mg via INTRAVENOUS
  Administered 2011-07-22: 1.8 mg via INTRAVENOUS
  Administered 2011-07-22: 1.5 mg via INTRAVENOUS
  Administered 2011-07-23: 2.7 mg via INTRAVENOUS
  Administered 2011-07-23: 2.1 mg via INTRAVENOUS
  Administered 2011-07-23: 13:00:00 via INTRAVENOUS
  Administered 2011-07-23: 1.8 mg via INTRAVENOUS
  Administered 2011-07-23: 3.3 mg via INTRAVENOUS
  Administered 2011-07-23: 02:00:00 via INTRAVENOUS
  Administered 2011-07-23: 4.5 mg via INTRAVENOUS
  Administered 2011-07-24: 0.3 mg via INTRAVENOUS
  Filled 2011-07-22 (×2): qty 25

## 2011-07-22 MED ORDER — LACTATED RINGERS IV SOLN: INTRAVENOUS | Status: DC

## 2011-07-22 MED ORDER — ONDANSETRON HCL 4 MG/2ML IJ SOLN: 4.0000 mg | Freq: Four times a day (QID) | INTRAMUSCULAR | Status: DC | PRN

## 2011-07-22 MED ORDER — BUPIVACAINE HCL (PF) 0.25 % IJ SOLN
INTRAMUSCULAR | Status: AC
  Filled 2011-07-22: qty 30

## 2011-07-22 MED ORDER — NALOXONE HCL 0.4 MG/ML IJ SOLN: 0.4000 mg | INTRAMUSCULAR | Status: DC | PRN

## 2011-07-22 MED ORDER — SODIUM CHLORIDE 0.9 % IV SOLN
INTRAVENOUS | Status: DC | PRN
  Administered 2011-07-22: 12:00:00 via INTRAVENOUS

## 2011-07-22 MED ORDER — GLYCOPYRROLATE 0.2 MG/ML IJ SOLN
INTRAMUSCULAR | Status: DC | PRN
  Administered 2011-07-22: 0.6 mg via INTRAVENOUS

## 2011-07-22 MED ORDER — CEFAZOLIN SODIUM-DEXTROSE 2-3 GM-% IV SOLR
2.0000 g | INTRAVENOUS | Status: AC
  Administered 2011-07-22: 2 g via INTRAVENOUS

## 2011-07-22 MED ORDER — ZOLPIDEM TARTRATE 5 MG PO TABS: 5.0000 mg | ORAL_TABLET | Freq: Every evening | ORAL | Status: DC | PRN

## 2011-07-22 MED ORDER — LIDOCAINE HCL (CARDIAC) 20 MG/ML IV SOLN
INTRAVENOUS | Status: DC | PRN
  Administered 2011-07-22: 50 mg via INTRAVENOUS

## 2011-07-22 MED ORDER — HYDROMORPHONE HCL PF 1 MG/ML IJ SOLN
INTRAMUSCULAR | Status: DC | PRN
  Administered 2011-07-22 (×4): 1 mg via INTRAVENOUS

## 2011-07-22 MED ORDER — SUFENTANIL CITRATE 50 MCG/ML IV SOLN
INTRAVENOUS | Status: DC | PRN
  Administered 2011-07-22: 20 ug via INTRAVENOUS
  Administered 2011-07-22 (×8): 10 ug via INTRAVENOUS

## 2011-07-22 MED ORDER — MAGNESIUM HYDROXIDE 400 MG/5ML PO SUSP: 30.0000 mL | Freq: Three times a day (TID) | ORAL | Status: DC | PRN

## 2011-07-22 MED ORDER — BUPIVACAINE HCL (PF) 0.25 % IJ SOLN
INTRAMUSCULAR | Status: DC | PRN
  Administered 2011-07-22: 10 mL

## 2011-07-22 MED ORDER — STERILE WATER FOR IRRIGATION IR SOLN
Status: DC | PRN
  Administered 2011-07-22: 3000 mL

## 2011-07-22 MED ORDER — MAGNESIUM HYDROXIDE 400 MG/5ML PO SUSP: 30.0000 mL | Freq: Three times a day (TID) | ORAL | Status: DC

## 2011-07-22 MED ORDER — MIDAZOLAM HCL 5 MG/5ML IJ SOLN
INTRAMUSCULAR | Status: DC | PRN
  Administered 2011-07-22: 2 mg via INTRAVENOUS

## 2011-07-22 MED ORDER — BUPIVACAINE LIPOSOME 1.3 % IJ SUSP
20.0000 mL | Freq: Once | INTRAMUSCULAR | Status: DC
  Filled 2011-07-22: qty 20

## 2011-07-22 MED ORDER — 0.9 % SODIUM CHLORIDE (POUR BTL) OPTIME
TOPICAL | Status: DC | PRN
  Administered 2011-07-22: 3000 mL

## 2011-07-22 MED ORDER — NEOSTIGMINE METHYLSULFATE 1 MG/ML IJ SOLN
INTRAMUSCULAR | Status: DC | PRN
  Administered 2011-07-22: 4 mg via INTRAVENOUS

## 2011-07-22 MED ORDER — KCL IN DEXTROSE-NACL 20-5-0.45 MEQ/L-%-% IV SOLN
INTRAVENOUS | Status: DC
  Administered 2011-07-22: 17:00:00 via INTRAVENOUS
  Administered 2011-07-23: 125 mL/h via INTRAVENOUS
  Administered 2011-07-23: 125 mL via INTRAVENOUS
  Administered 2011-07-24: 02:00:00 via INTRAVENOUS
  Filled 2011-07-22 (×10): qty 1000

## 2011-07-22 MED ORDER — SUCCINYLCHOLINE CHLORIDE 20 MG/ML IJ SOLN
INTRAMUSCULAR | Status: DC | PRN
  Administered 2011-07-22: 100 mg via INTRAVENOUS

## 2011-07-22 MED ORDER — CEFAZOLIN SODIUM-DEXTROSE 2-3 GM-% IV SOLR
INTRAVENOUS | Status: AC
  Filled 2011-07-22: qty 50

## 2011-07-22 MED ORDER — ACETAMINOPHEN 10 MG/ML IV SOLN
INTRAVENOUS | Status: DC | PRN
  Administered 2011-07-22: 1000 mg via INTRAVENOUS

## 2011-07-22 MED ORDER — LACTATED RINGERS IV SOLN
INTRAVENOUS | Status: DC | PRN
  Administered 2011-07-22 (×4): via INTRAVENOUS

## 2011-07-22 MED ORDER — DIPHENHYDRAMINE HCL 12.5 MG/5ML PO ELIX
12.5000 mg | ORAL_SOLUTION | Freq: Four times a day (QID) | ORAL | Status: DC | PRN
  Filled 2011-07-22: qty 5

## 2011-07-22 MED ORDER — HYDROMORPHONE HCL PF 1 MG/ML IJ SOLN: 0.2500 mg | INTRAMUSCULAR | Status: DC | PRN

## 2011-07-22 MED ORDER — DIPHENHYDRAMINE HCL 50 MG/ML IJ SOLN: 12.5000 mg | Freq: Four times a day (QID) | INTRAMUSCULAR | Status: DC | PRN

## 2011-07-22 SURGICAL SUPPLY — 81 items
ADH SKN CLS APL DERMABOND .7 (GAUZE/BANDAGES/DRESSINGS) ×2
APL SKNCLS STERI-STRIP NONHPOA (GAUZE/BANDAGES/DRESSINGS) ×2
BAG URINE DRAINAGE (UROLOGICAL SUPPLIES) ×2 IMPLANT
BARRIER ADHS 3X4 INTERCEED (GAUZE/BANDAGES/DRESSINGS) ×3 IMPLANT
BENZOIN TINCTURE PRP APPL 2/3 (GAUZE/BANDAGES/DRESSINGS) ×3 IMPLANT
BLADELESS LONG 8MM (BLADE) ×2 IMPLANT
BRR ADH 4X3 ABS CNTRL BYND (GAUZE/BANDAGES/DRESSINGS) ×2
CABLE HIGH FREQUENCY MONO STRZ (ELECTRODE) ×3 IMPLANT
CATH FOLEY 3WAY  5CC 16FR (CATHETERS)
CATH FOLEY 3WAY 5CC 16FR (CATHETERS) ×2 IMPLANT
CLOTH BEACON ORANGE TIMEOUT ST (SAFETY) ×3 IMPLANT
CORDS BIPOLAR (ELECTRODE) ×2 IMPLANT
COVER MAYO STAND STRL (DRAPES) ×4 IMPLANT
COVER TIP SHEARS 8 DVNC (MISCELLANEOUS) ×2 IMPLANT
COVER TIP SHEARS 8MM DA VINCI (MISCELLANEOUS)
DECANTER SPIKE VIAL GLASS SM (MISCELLANEOUS) ×2 IMPLANT
DERMABOND ADVANCED (GAUZE/BANDAGES/DRESSINGS) ×1
DERMABOND ADVANCED .7 DNX12 (GAUZE/BANDAGES/DRESSINGS) IMPLANT
DRAPE BACK TABLE (DRAPES) ×6 IMPLANT
DRAPE CAMERA CLOSED 9X96 (DRAPES) ×1 IMPLANT
DRAPE LG THREE QUARTER DISP (DRAPES) ×7 IMPLANT
DRAPE TABLE BACK 44X90 PK DISP (DRAPES) ×6 IMPLANT
DRAPE UTILITY XL STRL (DRAPES) ×1 IMPLANT
DRAPE WARM FLUID 44X44 (DRAPE) ×3 IMPLANT
ELECT REM PT RETURN 9FT ADLT (ELECTROSURGICAL) ×3
ELECTRODE REM PT RTRN 9FT ADLT (ELECTROSURGICAL) ×2 IMPLANT
FILTER SMOKE EVAC (FILTER) ×2 IMPLANT
GAUZE VASELINE 3X9 (GAUZE/BANDAGES/DRESSINGS) IMPLANT
GLOVE BIO SURGEON STRL SZ 6.5 (GLOVE) ×9 IMPLANT
GLOVE BIO SURGEON STRL SZ8 (GLOVE) ×6 IMPLANT
GLOVE BIOGEL PI IND STRL 7.0 (GLOVE) ×8 IMPLANT
GLOVE BIOGEL PI INDICATOR 7.0 (GLOVE) ×4
GLOVE ECLIPSE 6.5 STRL STRAW (GLOVE) ×12 IMPLANT
GOWN PREVENTION PLUS LG XLONG (DISPOSABLE) ×4 IMPLANT
GOWN STRL NON-REIN LRG LVL3 (GOWN DISPOSABLE) ×1 IMPLANT
GOWN STRL REIN XL XLG (GOWN DISPOSABLE) ×9 IMPLANT
KIT ACCESSORY DA VINCI DISP (KITS)
KIT ACCESSORY DVNC DISP (KITS) IMPLANT
MANIPULATOR UTERINE 4.5 ZUMI (MISCELLANEOUS) ×3 IMPLANT
NDL INSUFFLATION 14GA 120MM (NEEDLE) ×2 IMPLANT
NEEDLE INSUFFLATION 14GA 120MM (NEEDLE) IMPLANT
NS IRRIG 1000ML POUR BTL (IV SOLUTION) ×8 IMPLANT
OCCLUDER COLPOPNEUMO (BALLOONS) ×1 IMPLANT
PACK LAPAROSCOPY W LONG (CUSTOM PROCEDURE TRAY) ×2 IMPLANT
PACK ROBOTIC CUSTOM GYN (CUSTOM PROCEDURE TRAY) ×3 IMPLANT
PENCIL BUTTON HOLSTER BLD 10FT (ELECTRODE) ×1 IMPLANT
PLUG CATH AND CAP STER (CATHETERS) ×2 IMPLANT
SET IRRIG TUBING LAPAROSCOPIC (IRRIGATION / IRRIGATOR) ×3 IMPLANT
SHEET LAVH (DRAPES) ×2 IMPLANT
SLEEVE SURGEON STRL (DRAPES) ×4 IMPLANT
SOLUTION ELECTROLUBE (MISCELLANEOUS) ×3 IMPLANT
SPONGE LAP 18X18 X RAY DECT (DISPOSABLE) ×3 IMPLANT
STRIP CLOSURE SKIN 1/4X4 (GAUZE/BANDAGES/DRESSINGS) ×2 IMPLANT
SUT PDS AB 0 CTX 60 (SUTURE) ×2 IMPLANT
SUT PLAIN 2 0 XLH (SUTURE) ×1 IMPLANT
SUT VIC AB 0 CT1 27 (SUTURE) ×18
SUT VIC AB 0 CT1 27XBRD ANTBC (SUTURE) IMPLANT
SUT VIC AB 0 CT1 36 (SUTURE) ×11 IMPLANT
SUT VIC AB 2-0 CT1 27 (SUTURE) ×6
SUT VIC AB 2-0 CT1 TAPERPNT 27 (SUTURE) IMPLANT
SUT VICRYL 0 TIES 12 18 (SUTURE) ×1 IMPLANT
SUT VICRYL 0 UR6 27IN ABS (SUTURE) ×1 IMPLANT
SYR 20CC LL (SYRINGE) ×1 IMPLANT
SYR 50ML LL SCALE MARK (SYRINGE) ×3 IMPLANT
SYR BULB IRRIGATION 50ML (SYRINGE) ×1 IMPLANT
SYSTEM CONVERTIBLE TROCAR (TROCAR) IMPLANT
TIP UTERINE 5.1X6CM LAV DISP (MISCELLANEOUS) IMPLANT
TIP UTERINE 6.7X10CM GRN DISP (MISCELLANEOUS) IMPLANT
TIP UTERINE 6.7X6CM WHT DISP (MISCELLANEOUS) IMPLANT
TIP UTERINE 6.7X8CM BLUE DISP (MISCELLANEOUS) IMPLANT
TOWEL OR 17X26 10 PK STRL BLUE (TOWEL DISPOSABLE) ×9 IMPLANT
TOWEL OR NON WOVEN STRL DISP B (DISPOSABLE) ×3 IMPLANT
TROCAR 12M 150ML BLUNT (TROCAR) ×1 IMPLANT
TROCAR DISP BLADELESS 8 DVNC (TROCAR) ×2 IMPLANT
TROCAR DISP BLADELESS 8MM (TROCAR)
TROCAR Z-THREAD 12X150 (TROCAR) ×3 IMPLANT
TROCAR Z-THREAD BLADED 12X100M (TROCAR) ×1 IMPLANT
TROCAR Z-THREAD OPTICAL 5X100M (TROCAR) IMPLANT
TUBING FILTER THERMOFLATOR (ELECTROSURGICAL) ×3 IMPLANT
WARMER LAPAROSCOPE (MISCELLANEOUS) ×2 IMPLANT
YANKAUER SUCT BULB TIP 10FT TU (MISCELLANEOUS) ×1 IMPLANT

## 2011-07-22 NOTE — Progress Notes (Signed)
Patient is alert and oriented, vital signs are stable, incision to abdomen are within normal limits , patient with foley draining adequate urine, bowel sounds are hypoactive, dilaudid pca effective with pain control, pt with no complaints of nausea or vomiting and tolerating clear liquid diet, daughters at bedside and supportive of patient, pt with ETCO2 continuous monitoring, will continue to monitor Means, Myrtie Hawk RN 07-22-11 18:48pm

## 2011-07-22 NOTE — Anesthesia Postprocedure Evaluation (Signed)
  Anesthesia Post-op Note  Patient: Erika Bennett  Procedure(s) Performed: Procedure(s) (LRB): ROBOTIC ASSISTED TOTAL HYSTERECTOMY (N/A) BILATERAL SALPINGECTOMY (N/A) HYSTERECTOMY ABDOMINAL ()  Patient Location: PACU  Anesthesia Type: General  Level of Consciousness: awake and alert   Airway and Oxygen Therapy: Patient Spontanous Breathing  Post-op Pain: mild  Post-op Assessment: Post-op Vital signs reviewed, Patient's Cardiovascular Status Stable, Respiratory Function Stable, Patent Airway and No signs of Nausea or vomiting  Post-op Vital Signs: stable  Complications: No apparent anesthesia complications

## 2011-07-22 NOTE — Interval H&P Note (Signed)
History and Physical Interval Note:  07/22/2011 7:17 AM  Erika Bennett  has presented today for surgery, with the diagnosis of uterine fiborids   The various methods of treatment have been discussed with the patient and family. After consideration of risks, benefits and other options for treatment, the patient has consented to  Procedure(s) (LRB): ROBOTIC ASSISTED TOTAL HYSTERECTOMY (N/A) BILATERAL SALPINGECTOMY (N/A) as a surgical intervention .  The patients' history has been reviewed, patient examined, no change in status, stable for surgery.  I have reviewed the patients' chart and labs.  Questions were answered to the patient's satisfaction.     JACKSON-MOORE,Jlyn Bracamonte A

## 2011-07-22 NOTE — Transfer of Care (Signed)
Immediate Anesthesia Transfer of Care Note  Patient: Erika Bennett  Procedure(s) Performed: Procedure(s) (LRB): ROBOTIC ASSISTED TOTAL HYSTERECTOMY (N/A) BILATERAL SALPINGECTOMY (N/A) HYSTERECTOMY ABDOMINAL ()  Patient Location: PACU  Anesthesia Type: General  Level of Consciousness: awake, sedated and patient cooperative  Airway & Oxygen Therapy: Patient Spontanous Breathing and Patient connected to face mask oxygen  Post-op Assessment: Report given to PACU RN and Post -op Vital signs reviewed and stable  Post vital signs: Reviewed and stable  Complications: No apparent anesthesia complications

## 2011-07-22 NOTE — Op Note (Signed)
Hysterectomy Procedure Note  Indications: Uterine fibroids; Abnormal uterine bleeding  Pre-operative Diagnosis: See above  Post-operative Diagnosis: Same  Operation: Attempted Total Robotic hysterectomy; Supracervical abdominal hysterectomy, right salpingo-oophorectomy; left salpingectomy  Surgeon: Roseanna Rainbow   Assistants: Coral Ceo A  Anesthesia: General endotracheal anesthesia  ASA Class: per Anesthesiology  Procedure Details  The patient was seen in the Holding Room. The risks, benefits, complications, treatment options, and expected outcomes were discussed with the patient.  The patient concurred with the proposed plan, giving informed consent.   The patient was  identified as Erika Bennett and the procedure verified as a total robotic hysterectomy with bilateral salpingectomies. A Time Out was held and the above information confirmed.  After induction of anesthesia, the patient was draped and prepped in the usual sterile manner. Pt was placed in supine position after anesthesia and draped and prepped in the usual sterile manner. The abdominal drape was placed after the CholoraPrep had been allowed to dry for 3 minutes.  Her arms were tucked to her side with all appropriate precautions.  The shoulder blocks were placed in the usual fashion.  The patient was placed in the semi-lithotomy position in [place] stirrups.  The perineum was prepped with Betadine.  Foley catheter was placed.  A sterile speculum was placed in the vagina.  The cervix was grasped with a single-tooth tenaculum and dilated with Shawnie Pons dilators.  The ZUMI uterine manipulator with a medium colpotomizer ring was placed without difficulty.  A pneum occluder balloon was placed over the manipulator.  A second time-out was performed.    The  skin at the umbilicus was anesthestized with 0.25% Marcaine.  A 5 mm incision was made and using a 5 mm Optiview, a 5 mm trocar was placed under direct vision.  The patient's  abdomen was insufflated with CO2 gas.  At this point and all points during the procedure, the patient's intra-abdominal pressure did not exceed 15 mmHg.  This incision was extended to accomadate a  10 mm trocar which was advanced into the abdomen.   Bilateral 8 mm ports were place 10 cm and 15 degrees inferior. A 10 mm trocar was placed at Palmer's point.   All ports were placed under direct visualization.   The robot was docked in the usual fashion.  The round ligament on the right was transected with monopolar cautery.  The anterior leaf of the broad ligament was opened.  At this point, the camera was in close proximity to the target anatomy.  As this could not be optimized, the decision was made to proceed with an open approach.   The instruments were then removed under direct visualization and the robotic ports removed.  The robot was undocked.  Deep, subcutaneous, figure-of-eight 0-Vicryl sutures on a UR-6 needle were placed in the 10-12 mm supraumbilical and infracostal incisions.  Dermabond was applied to the skin incisions. The ZUMI uterine manipulator and Koh ring were removed from the vagina.  A transverse incision was made and carried through the subcutaneous tissue to the fascia. The fascial incision was made and extended. The rectus muscles were separated. The peritoneum was identified and entered. Peritoneal incision was extended longitudinally.  A moderately enlarged, fibroid uterus and normal ovaries were noted. The midisthmic portion of the fallopian tubes were attenuated.   An Lenox Ahr  retractor was placed and bowel was packed away with moistened laparotomy packs.   The round ligament on the left was identified and cut with the bovie. The  anterior peritoneal reflection was incised and the bladder was dissected off the lower uterine segment. The retroperitoneal space was explored.  The right infundibulo-pelvic ligament was grasped and cut.  A free ligature and  suture ligature with  2-0-Vicryl were placed.  The left utero-ovarian ligament and proximal fallopian tube were grasped and cut. A free ligature and  suture ligature with 2-0-Vicryl were placed. The uterine vessels were skeletonized, then clamped, cut and suture ligated with 0-Vicryl suture. The uterus was then amputated at the level of the internal os.  The endocervix was cauterized with the Bovie. The cervix was then over sewn with interrupted figure-of-eight sutures of 0- Vicryl. The pelvis was copiously irrigated. Bleeding points on the cervical stump were secured with figure-of-eight sutures of O- Vicryl.  A bleeding point on the left mesolsalpinx was suture ligated.  Adequate hemostasis was assured. The retractor and all packing were removed from the abdomen. The fascia was approximated with running sutures of 0-PDS. The subcutaneous layer was infiltrated with Exparel and irrigated. The dead space was obliterated with a running suture of 2-0 plain.  Hemostasis was observed. The skin was approximated with staples.  Instrument, sponge, and needle counts were correct prior to abdominal closure and at the conclusion of the case.   Findings:  See above    Estimated Blood Loss:  1000 ml         Drains: none         Total IV Fluids: per Anesthesiology         Specimens: Uterus, fallopian tubes, right ovary         Implants: none         Complications:  Intraoperative hemorrhage         Disposition: PACU - hemodynamically stable.         Condition: stable

## 2011-07-23 LAB — CBC
HCT: 29.5 % — ABNORMAL LOW (ref 36.0–46.0)
Hemoglobin: 9.7 g/dL — ABNORMAL LOW (ref 12.0–15.0)
MCH: 29.9 pg (ref 26.0–34.0)
MCHC: 32.9 g/dL (ref 30.0–36.0)
MCV: 91 fL (ref 78.0–100.0)
Platelets: 279 K/uL (ref 150–400)
RBC: 3.24 MIL/uL — ABNORMAL LOW (ref 3.87–5.11)
RDW: 15.2 % (ref 11.5–15.5)
WBC: 10.3 K/uL (ref 4.0–10.5)

## 2011-07-23 LAB — BASIC METABOLIC PANEL
CO2: 28 mEq/L (ref 19–32)
Chloride: 106 mEq/L (ref 96–112)
Creatinine, Ser: 0.63 mg/dL (ref 0.50–1.10)
GFR calc Af Amer: >90 mL/min (ref >90)
Potassium: 3.9 mEq/L (ref 3.5–5.1)

## 2011-07-23 NOTE — Progress Notes (Signed)
Subjective: Patient reports no problems voiding.  - Flatus.   Objective: I have reviewed patient's vital signs, intake and output, medications and labs.  General: alert and no distress Resp: clear to auscultation bilaterally Cardio: regular rate and rhythm, S1, S2 normal, no murmur, click, rub or gallop GI: normal findings: soft, non-tender and -BS and incision: clean, dry and intact Extremities: extremities normal, atraumatic, no cyanosis or edema Vaginal Bleeding: minimal   Assessment/Plan: Stable.  POD#1   Attempted robotic assisted TLH.  TAH/RSO/LS.  Hgb 9.7                                                                                                                                                                                                                                                                                                                                                            LOS: 1 day    Reagan Klemz A 07/23/2011, 11:29 AM

## 2011-07-24 MED ORDER — BISACODYL 10 MG RE SUPP: 10.0000 mg | Freq: Every day | RECTAL | Status: DC | PRN

## 2011-07-24 MED ORDER — BISACODYL 10 MG RE SUPP
10.0000 mg | Freq: Once | RECTAL | Status: AC
  Administered 2011-07-24: 10 mg via RECTAL
  Filled 2011-07-24: qty 1

## 2011-07-24 NOTE — Progress Notes (Signed)
2 Days Post-Op Procedure(s) (LRB): ROBOTIC ASSISTED TOTAL HYSTERECTOMY (N/A) BILATERAL SALPINGECTOMY (N/A) HYSTERECTOMY ABDOMINAL ()  Subjective: Patient reports tolerating PO, + flatus and no problems voiding.    Objective: I have reviewed patient's vital signs, intake and output, medications and labs.  General: alert and no distress GI: soft, non-tender; bowel sounds normal; no masses,  no organomegaly Extremities: extremities normal, atraumatic, no cyanosis or edema Vaginal Bleeding: minimal Incisions:  Clean, dry and intact.  Nontender. Assessment: s/p Procedure(s) (LRB): ROBOTIC ASSISTED TOTAL HYSTERECTOMY (N/A) BILATERAL SALPINGECTOMY (N/A) HYSTERECTOMY ABDOMINAL (): stable, progressing well and tolerating diet  Plan: Advance diet Encourage ambulation Advance to PO medication Discontinue IV fluids  LOS: 2 days    Erika Bennett A 07/24/2011, 9:21 AM

## 2011-07-25 DIAGNOSIS — D259 Leiomyoma of uterus, unspecified: Secondary | ICD-10-CM | POA: Diagnosis present

## 2011-07-25 MED ORDER — OXYCODONE-ACETAMINOPHEN 5-325 MG PO TABS: 1.0000 | ORAL_TABLET | Freq: Four times a day (QID) | ORAL | Status: AC | PRN

## 2011-07-25 NOTE — Discharge Summary (Signed)
Physician Discharge Summary  Patient ID: Erika Bennett MRN: [id number] DOB/AGE: 1961/01/06 51 y.o.  Admit date: 07/22/2011 Discharge date: 07/25/2011  Admission Diagnoses: Irregular menstrual cycle  Discharge Diagnoses:  Principal Problem:  *Irregular menstrual cycle Active Problems:  Uterine leiomyoma   Discharged Condition: good  Hospital Course: On 07/22/2011, the patient underwent the following: Procedure(s): ROBOTIC ASSISTED TOTAL HYSTERECTOMY BILATERAL SALPINGECTOMY HYSTERECTOMY ABDOMINAL.   This was complicated by intraoperative hemorrhaged.  The patient was transfused 2 units PRBC.  The postoperative course was uneventful.  She was discharged to home on postoperative day 3 tolerating a regular diet.  Consults: None  Significant Diagnostic Studies: none  Treatments: see above  Discharge Exam: Blood pressure 135/65, pulse 84, temperature 99.6 F (37.6 C), temperature source Oral, resp. rate 20, height 5\' 4"  (1.626 m), weight 141.976 kg (313 lb), last menstrual period 07/17/2011, SpO2 100.00%. General appearance: alert GI: soft, non-tender; bowel sounds normal; no masses,  no organomegaly Extremities: extremities normal, atraumatic, no cyanosis or edema Skin: Skin color, texture, turgor normal. No rashes or lesions or Ecchymoses--abdomen Incision/Wound:  C/D/I  Disposition: 01-Home or Self Care  Discharge Orders    Future Appointments: Provider: Department: Dept Phone: Center:   07/29/2011 4:25 PM [facility] [phone]     Future Orders Please Complete By Expires   Diet - low sodium heart healthy      Increase activity slowly      May walk up steps      May shower / Bathe      Comments:   No tub baths for 6 weeks   Driving Restrictions      Comments:   No driving for 1- 2 weeks   Lifting restrictions      Comments:   No lifting > 30 lbs for 6 weeks   Sexual Activity Restrictions      Comments:   No intercourse for 6 - 8 weeks   Discharge  wound care:      Comments:   Keep clean and dry   Call MD for:  temperature >100.4      Call MD for:  persistant nausea and vomiting      Call MD for:  severe uncontrolled pain      Call MD for:  redness, tenderness, or signs of infection (pain, swelling, redness, odor or green/yellow discharge around incision site)      Call MD for:  persistant dizziness or light-headedness      Call MD for:  extreme fatigue        Medication List  As of 07/25/2011  8:31 AM   STOP taking these medications         acetaminophen 500 MG tablet         TAKE these medications         CALTRATE 600 PO   Take 1 tablet by mouth daily with breakfast.      cholecalciferol 1000 UNITS tablet   Commonly known as: VITAMIN D   Take 2,000 Units by mouth every morning.      cholecalciferol 1000 UNITS tablet   Commonly known as: VITAMIN D   Take 2,000 Units by mouth daily with breakfast.      diclofenac 75 MG EC tablet   Commonly known as: VOLTAREN   Take 75 mg by mouth 2 (two) times daily.      diphenhydramine-acetaminophen 25-500 MG Tabs   Commonly known as: TYLENOL PM   Take 2 tablets by mouth at  bedtime as needed.      INTEGRA PLUS Caps   Take 1 capsule by mouth every morning.      mulitivitamin with minerals Tabs   Take 1 tablet by mouth daily with breakfast. One-a-day women's multivitamin      OVER THE COUNTER MEDICATION   Take 2 capsules by mouth as needed. Tylenol arthritis      oxyCODONE-acetaminophen 5-325 MG per tablet   Commonly known as: PERCOCET   Take 1-2 tablets by mouth every 6 (six) hours as needed (moderate to severe pain (when tolerating fluids)).           Follow-up Information    Follow up with Antionette Char A, MD. Schedule an appointment as soon as possible for a visit in 1 week.   Contact information:   [address], [address] [place] [address] [phone]          Signed: Roseanna Rainbow 07/25/2011, 8:31 AM

## 2011-07-25 NOTE — Discharge Instructions (Signed)
Hysterectomy, Abdominal  °Care After °Please read the instructions below. Refer to these instructions for the next few weeks. These instructions provide you with general information on caring for yourself after surgery. Your caregiver may also give you specific instructions. While your treatment has been planned according to the most current medical practices available, unavoidable problems sometimes happen. If you have any problems or questions after you leave, please call your caregiver. °HOME CARE INSTRUCTIONS °Healing will take time. You will have discomfort, tenderness, swelling and bruising at the operative site for a couple of weeks. This is normal and will get better as time goes on.  °· Only take over-the-counter or prescription medicines for pain, discomfort or fever as directed by your caregiver.  °· Do not take aspirin. It can cause bleeding.  °· Do not drive when taking pain medication.  °· Follow your caregiver’s advice regarding diet, exercise, lifting, driving and general activities.  °· Resume your usual diet as directed and allowed.  °· Get plenty of rest and sleep.  °· Do not douche, use tampons, or have sexual intercourse until your caregiver gives you permission.  °· Change your bandages (dressings) as directed.  °· Take your temperature twice a day. Write it down.  °· Your caregiver may recommend showers instead of baths for a few weeks.  °· Do not drink alcohol until your caregiver gives you permission.  °· If you develop constipation, you may take a mild laxative with your caregiver’s permission. Bran foods and drinking fluids helps with constipation problems.  °· Try to have someone home with you for a week or two to help with the household activities.  °· Make sure you and your family understands everything about your operation and recovery.  °· Do not sign any legal documents until you feel normal again.  °· Keep all your follow-up appointments as recommended by your caregiver.  °SEEK  MEDICAL CARE IF: °· There is swelling, redness or increasing pain in the wound area.  °· Pus is coming from the wound.  °· You notice a bad smell from the wound or surgical dressing.  °· You have pain, redness and swelling from the intravenous site.  °· The wound is breaking open (the edges are not staying together).  °· You feel dizzy or feel like fainting.  °· You develop pain or bleeding when you urinate.  °· You develop diarrhea.  °· You develop nausea and vomiting.  °· You develop abnormal vaginal discharge.  °· You develop a rash.  °· You have any type of abnormal reaction or develop an allergy to your medication.  °· You need stronger pain medication for your pain.  °SEEK IMMEDIATE MEDICAL CARE: °· You develop a temperature of 100.6 or higher.  °· You develop abdominal pain.  °· You develop chest pain.  °· You develop shortness of breath.  °· You pass out.  °· You develop pain, swelling or redness of your leg.  °· You develop heavy vaginal bleeding with or without blood clots.  °Document Released: 10/01/2004 Document Re-Released: 09/01/2009 °[employer]® Patient Information ©2011 [employer]. °

## 2011-07-26 ENCOUNTER — Encounter (HOSPITAL_COMMUNITY): Payer: Self-pay | Admitting: Obstetrics & Gynecology

## 2011-07-26 LAB — TYPE AND SCREEN
ABO/RH(D): O POS
Unit division: 0
Unit division: 0

## 2011-07-29 ENCOUNTER — Ambulatory Visit (HOSPITAL_COMMUNITY)
Admission: RE | Admit: 2011-07-29 | Discharge: 2011-07-29 | Disposition: A | Discharge status: Home / Self Care | Payer: BC Managed Care – PPO | Source: Ambulatory Visit | Attending: Obstetrics & Gynecology | Admitting: Obstetrics & Gynecology

## 2011-07-29 DIAGNOSIS — Z1231 Encounter for screening mammogram for malignant neoplasm of breast: Secondary | ICD-10-CM | POA: Insufficient documentation

## 2012-01-05 DIAGNOSIS — M2240 Chondromalacia patellae, unspecified knee: Secondary | ICD-10-CM | POA: Insufficient documentation

## 2012-01-05 DIAGNOSIS — M1710 Unilateral primary osteoarthritis, unspecified knee: Secondary | ICD-10-CM | POA: Insufficient documentation

## 2012-01-17 ENCOUNTER — Ambulatory Visit: Payer: BC Managed Care – PPO | Attending: Orthopedic Surgery | Admitting: Physical Therapy

## 2012-01-17 DIAGNOSIS — M25569 Pain in unspecified knee: Secondary | ICD-10-CM | POA: Insufficient documentation

## 2012-01-17 DIAGNOSIS — M25669 Stiffness of unspecified knee, not elsewhere classified: Secondary | ICD-10-CM | POA: Insufficient documentation

## 2012-01-17 DIAGNOSIS — IMO0001 Reserved for inherently not codable concepts without codable children: Secondary | ICD-10-CM | POA: Insufficient documentation

## 2012-01-24 ENCOUNTER — Ambulatory Visit: Payer: BC Managed Care – PPO | Admitting: Physical Therapy

## 2012-01-26 ENCOUNTER — Ambulatory Visit: Payer: BC Managed Care – PPO | Admitting: Rehabilitation

## 2012-01-30 ENCOUNTER — Ambulatory Visit: Payer: BC Managed Care – PPO | Attending: Orthopedic Surgery | Admitting: Physical Therapy

## 2012-01-30 ENCOUNTER — Encounter: Payer: BC Managed Care – PPO | Admitting: Rehabilitation

## 2012-01-30 DIAGNOSIS — M25669 Stiffness of unspecified knee, not elsewhere classified: Secondary | ICD-10-CM | POA: Insufficient documentation

## 2012-01-30 DIAGNOSIS — M25569 Pain in unspecified knee: Secondary | ICD-10-CM | POA: Insufficient documentation

## 2012-01-30 DIAGNOSIS — IMO0001 Reserved for inherently not codable concepts without codable children: Secondary | ICD-10-CM | POA: Insufficient documentation

## 2012-02-02 ENCOUNTER — Ambulatory Visit: Payer: BC Managed Care – PPO | Admitting: Physical Therapy

## 2012-02-06 ENCOUNTER — Ambulatory Visit: Payer: BC Managed Care – PPO | Admitting: Physical Therapy

## 2012-02-09 ENCOUNTER — Ambulatory Visit: Payer: BC Managed Care – PPO | Admitting: Rehabilitation

## 2012-02-13 ENCOUNTER — Ambulatory Visit: Payer: BC Managed Care – PPO | Admitting: Rehabilitation

## 2012-02-16 ENCOUNTER — Ambulatory Visit: Payer: BC Managed Care – PPO | Admitting: Physical Therapy

## 2012-02-20 ENCOUNTER — Ambulatory Visit: Payer: BC Managed Care – PPO | Admitting: Physical Therapy

## 2012-02-22 ENCOUNTER — Ambulatory Visit: Payer: BC Managed Care – PPO | Admitting: Physical Therapy

## 2012-02-27 ENCOUNTER — Ambulatory Visit: Payer: BC Managed Care – PPO | Attending: Orthopedic Surgery | Admitting: Physical Therapy

## 2012-02-27 DIAGNOSIS — IMO0001 Reserved for inherently not codable concepts without codable children: Secondary | ICD-10-CM | POA: Insufficient documentation

## 2012-02-27 DIAGNOSIS — M25669 Stiffness of unspecified knee, not elsewhere classified: Secondary | ICD-10-CM | POA: Insufficient documentation

## 2012-02-27 DIAGNOSIS — M25569 Pain in unspecified knee: Secondary | ICD-10-CM | POA: Insufficient documentation

## 2012-03-01 ENCOUNTER — Ambulatory Visit: Payer: BC Managed Care – PPO | Admitting: Physical Therapy

## 2012-03-05 ENCOUNTER — Encounter: Payer: BC Managed Care – PPO | Admitting: Rehabilitation

## 2012-03-08 ENCOUNTER — Ambulatory Visit: Payer: BC Managed Care – PPO | Admitting: Physical Therapy

## 2012-08-08 ENCOUNTER — Other Ambulatory Visit (HOSPITAL_COMMUNITY): Payer: Self-pay | Admitting: Family Medicine

## 2012-08-08 DIAGNOSIS — Z1231 Encounter for screening mammogram for malignant neoplasm of breast: Secondary | ICD-10-CM

## 2012-08-10 ENCOUNTER — Ambulatory Visit (HOSPITAL_COMMUNITY)
Admission: RE | Admit: 2012-08-10 | Discharge: 2012-08-10 | Disposition: A | Discharge status: Home / Self Care | Payer: BC Managed Care – PPO | Source: Ambulatory Visit | Attending: Family Medicine | Admitting: Family Medicine

## 2012-08-10 DIAGNOSIS — Z1231 Encounter for screening mammogram for malignant neoplasm of breast: Secondary | ICD-10-CM | POA: Insufficient documentation

## 2012-12-07 ENCOUNTER — Other Ambulatory Visit: Payer: Self-pay | Admitting: Family Medicine

## 2012-12-07 DIAGNOSIS — R7989 Other specified abnormal findings of blood chemistry: Secondary | ICD-10-CM

## 2012-12-10 ENCOUNTER — Ambulatory Visit
Admission: RE | Admit: 2012-12-10 | Discharge: 2012-12-10 | Disposition: A | Discharge status: Home / Self Care | Payer: No Typology Code available for payment source | Source: Ambulatory Visit | Attending: Family Medicine | Admitting: Family Medicine

## 2012-12-10 DIAGNOSIS — R7989 Other specified abnormal findings of blood chemistry: Secondary | ICD-10-CM

## 2013-06-19 ENCOUNTER — Other Ambulatory Visit (HOSPITAL_COMMUNITY): Payer: Self-pay | Admitting: Family Medicine

## 2013-06-19 DIAGNOSIS — Z1231 Encounter for screening mammogram for malignant neoplasm of breast: Secondary | ICD-10-CM

## 2013-08-12 ENCOUNTER — Ambulatory Visit (HOSPITAL_COMMUNITY)
Admission: RE | Admit: 2013-08-12 | Discharge: 2013-08-12 | Disposition: A | Discharge status: Home / Self Care | Payer: BC Managed Care – PPO | Source: Ambulatory Visit | Attending: Family Medicine | Admitting: Family Medicine

## 2013-08-12 DIAGNOSIS — Z1231 Encounter for screening mammogram for malignant neoplasm of breast: Secondary | ICD-10-CM

## 2016-01-04 DIAGNOSIS — R635 Abnormal weight gain: Secondary | ICD-10-CM | POA: Insufficient documentation

## 2016-01-04 DIAGNOSIS — L659 Nonscarring hair loss, unspecified: Secondary | ICD-10-CM | POA: Insufficient documentation

## 2016-01-04 DIAGNOSIS — G894 Chronic pain syndrome: Secondary | ICD-10-CM | POA: Insufficient documentation

## 2016-01-04 DIAGNOSIS — G479 Sleep disorder, unspecified: Secondary | ICD-10-CM | POA: Insufficient documentation

## 2016-01-04 DIAGNOSIS — F332 Major depressive disorder, recurrent severe without psychotic features: Secondary | ICD-10-CM | POA: Insufficient documentation

## 2016-02-01 DIAGNOSIS — I10 Essential (primary) hypertension: Secondary | ICD-10-CM | POA: Insufficient documentation

## 2016-05-16 ENCOUNTER — Encounter: Payer: Self-pay | Admitting: Physical Medicine & Rehabilitation

## 2016-05-19 DIAGNOSIS — IMO0001 Reserved for inherently not codable concepts without codable children: Secondary | ICD-10-CM | POA: Insufficient documentation

## 2016-05-19 DIAGNOSIS — M5136 Other intervertebral disc degeneration, lumbar region: Secondary | ICD-10-CM | POA: Insufficient documentation

## 2016-05-19 DIAGNOSIS — M51369 Other intervertebral disc degeneration, lumbar region without mention of lumbar back pain or lower extremity pain: Secondary | ICD-10-CM | POA: Insufficient documentation

## 2016-06-10 ENCOUNTER — Ambulatory Visit: Payer: BC Managed Care – PPO | Admitting: Physical Medicine & Rehabilitation

## 2016-06-16 ENCOUNTER — Encounter (HOSPITAL_COMMUNITY): Payer: Self-pay | Admitting: Psychiatry

## 2016-06-16 ENCOUNTER — Encounter (INDEPENDENT_AMBULATORY_CARE_PROVIDER_SITE_OTHER): Payer: Self-pay

## 2016-06-16 ENCOUNTER — Ambulatory Visit (INDEPENDENT_AMBULATORY_CARE_PROVIDER_SITE_OTHER): Payer: No Typology Code available for payment source | Admitting: Psychiatry

## 2016-06-16 VITALS — BP 112/68 | HR 68 | Ht 64.0 in | Wt >= 6400 oz

## 2016-06-16 DIAGNOSIS — Z79899 Other long term (current) drug therapy: Secondary | ICD-10-CM | POA: Diagnosis not present

## 2016-06-16 DIAGNOSIS — F331 Major depressive disorder, recurrent, moderate: Secondary | ICD-10-CM | POA: Diagnosis not present

## 2016-06-16 MED ORDER — BUPROPION HCL ER (XL) 150 MG PO TB24: 150.0000 mg | ORAL_TABLET | ORAL | 2 refills | Status: DC

## 2016-06-16 MED ORDER — GABAPENTIN 300 MG PO CAPS: 300.0000 mg | ORAL_CAPSULE | Freq: Four times a day (QID) | ORAL | 2 refills | Status: DC

## 2016-06-16 NOTE — Progress Notes (Signed)
Psychiatric Initial Adult Assessment   Patient Identification: Erika Bennett MRN:  [id number] Date of Evaluation:  06/16/2016 Referral Source: pcp Chief Complaint:   Chief Complaint    Depression    depression, grief Visit Diagnosis:    ICD-9-CM ICD-10-CM   1. Moderate episode of recurrent major depressive disorder (HCC) 296.32 F33.1     History of Present Illness:  Erika Bennett is a 56 year old female with a history of obesity, hypertension and major depressive disorder. She presents today for psychiatric intake assessment. She was tried on Cymbalta and try until ask by her primary care physician, but had a equivocal or negative response to both of these medications.  Patient reports that she has struggled with depression for the past 5 years off and on, in the setting of significant family losses and stressors. Her husband passed away about 5-6 years ago, after battling cancer for 2 years. She shares that her and her husband were very very close and have been together since they were age 77. She has lived with her 2 daughters and their children for the past several years, and has continued to work at a [employer]. She has a fairly limited social circle, but is close with her family. A few years ago her sister also passed away from sudden cardiac death, and the patient continues to grieve this sudden and unexpected loss. She reports that her extended family has largely passed away over the years, and her mother passed away at the young age of 30. The patient worries that she will also passed away at a young age due to all of her medical and health conditions.  The patient reports that she is currently taking the past 6-8 weeks off of work for depression and chronic back pain. She reports that her depression has precipitously worsened in the past year. She has had an increase in  depressed mood, tearfulness, low energy, fatigue, evening insomnia, stress eating, and this is exacerbated  by her chronic back and sciatic pains. She presents today for psychiatric consultation on medication management.  She reports that she has periodic panic attacks throughout the week as well, where she feels so overwhelmed by her negative symptoms of depression. She also is hesitant to leave the house because of worries about her bowel and bladder incontinence as a result of her nerve related back pain.  She has tried fluoxetine in the past, and the 1990s. She reports that she has never had any episodes of mania, and denies any symptoms concerning for psychosis or significant mood lability. She does have a couple days here and there where her mood will be a little bit better and more bright, but then she will begin to ruminate or about the negative things that have happened over the years, and we'll begin to feel depressed again. She denies any suicidal thoughts, but does feel like a burden to her family and sometimes wishes that she could just pass away in her sleep she denies any intents or plan to harm herself ever.  Discussing medication management and therapy options, the patient is agreeable to engaging in individual therapy in this office. I spent time discussing the potential benefits of Wellbutrin, given that it is activating, would help with energy, and help reduce her stress eating behaviors. Went over the risks and benefits and confirmed that she does not have a history of seizures.  Also discussed adding gabapentin 3 times daily, for neuropathic and chronic back pain, and given the  benefits on reducing anxiety and irritability.  Associated Signs/Symptoms: Depression Symptoms:  depressed mood, anhedonia, hypersomnia, psychomotor retardation, feelings of worthlessness/guilt, difficulty concentrating, hopelessness, recurrent thoughts of death, anxiety, panic attacks, loss of energy/fatigue, disturbed sleep, weight gain, increased appetite, (Hypo) Manic Symptoms:  none Anxiety Symptoms:   Excessive Worry, Social Anxiety, Psychotic Symptoms:  none PTSD Symptoms: Negative  Past Psychiatric History: Psychiatric history of depression, and suspected postpartum depression. No psychiatric hospitalizations. No prior suicide attempts.  Previous Psychotropic Medications: Yes   Substance Abuse History in the last 12 months:  No.  Consequences of Substance Abuse: Negative  Past Medical History:  Past Medical History:  Diagnosis Date  . Abscess of perineum   . Anemia    states bleeding vaginally since 2/13  . Arthritis   . Blood transfusion    age 75 months  . Depression    controlled/ states death of husband 07/11/2009  . GERD (gastroesophageal reflux disease)   . Heart murmur   . Obesity   . Sleep apnea    last used CPAP 07/12/02    Past Surgical History:  Procedure Laterality Date  . ABDOMINAL HYSTERECTOMY  07/22/2011   Procedure: HYSTERECTOMY ABDOMINAL;  Surgeon: Lahoma Crocker, MD;  Location: [facility];  Service: Gynecology;;  . abscess peri rectal     I&d  . DILATION AND CURETTAGE OF UTERUS    . GASTRIC BYPASS  07-11-01  . peri rectal    . TUBAL LIGATION      Family Psychiatric History: No family psychiatric history that she is aware of   Family History:  Family History  Problem Relation Age of Onset  . Heart disease Father     heart attacks  . Cancer Brother     prostate    Social History:   Social History   Social History  . Marital status: Widowed    Spouse name: N/A  . Number of children: N/A  . Years of education: N/A   Social History Main Topics  . Smoking status: Never Smoker  . Smokeless tobacco: Never Used  . Alcohol use No     Comment: 2-3 drinks week  . Drug use: No  . Sexual activity: Not Currently   Other Topics Concern  . None   Social History Narrative  . None    Additional Social History: Currently lives with her daughters and grandchildren in [place]. She reports that she is close with her family and they generally have a  good relationship. 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 amLODipine (NORVASC) 10 MG tablet Take 10 mg by mouth.    Marland Kitchen buPROPion (WELLBUTRIN XL) 150 MG 24 hr tablet Take 1 tablet (150 mg total) by mouth every morning. 30 tablet 2  . Calcium Carbonate (CALTRATE 600 PO) Take 1 tablet by mouth daily with breakfast.    . cholecalciferol (VITAMIN D) 1000 UNITS tablet Take 2,000 Units by mouth daily with breakfast.    . diclofenac (VOLTAREN) 75 MG EC tablet Take 75 mg by mouth 2 (two) times daily.    . diphenhydramine-acetaminophen (TYLENOL PM) 25-500 MG TABS Take 2 tablets by mouth at bedtime as needed.    . FeFum-FePoly-FA-B Cmp-C-Biot (INTEGRA  PLUS) CAPS Take 1 capsule by mouth every morning.    . gabapentin (NEURONTIN) 300 MG capsule Take 1 capsule (300 mg total) by mouth 4 (four) times daily. 120 capsule 2  . HYDROcodone-acetaminophen (NORCO/VICODIN) 5-325 MG tablet TAKE 1 TABLET BY MOUTH TWICE DAILY AS NEEDED FOR SEVERE PAIN.  0  . Multiple Vitamin (MULITIVITAMIN WITH MINERALS) TABS Take 1 tablet by mouth daily with breakfast. One-a-day women's multivitamin    . OVER THE COUNTER MEDICATION Take 2 capsules by mouth as needed. Tylenol arthritis     No current facility-administered medications for this visit.     Neurologic: Headache: Negative Seizure: Negative Paresthesias:Yes  Musculoskeletal: Strength & Muscle Tone: abnormal Gait & Station: unsteady, broad based Patient leans: N/A  Psychiatric Specialty Exam: Review of Systems  Constitutional: Negative.   HENT: Negative.   Respiratory: Negative.   Cardiovascular: Negative.   Gastrointestinal:       Bowel  incontinence   Musculoskeletal: Positive for back pain, joint pain and myalgias.  Neurological: Positive for tingling.  Psychiatric/Behavioral: Positive for depression. The patient is nervous/anxious and has insomnia.     Blood pressure 112/68, pulse 68, height 5\' 4"  (1.626 m), weight (!) 189.1 kg (416 lb 12.8 oz), last menstrual period 07/17/2011, SpO2 99 %.Body mass index is 71.54 kg/m.  General Appearance: Fairly Groomed  Eye Contact:  Fair  Speech:  Clear and Coherent  Volume:  Normal  Mood:  Depressed and Dysphoric  Affect:  Tearful  Thought Process:  Coherent and Goal Directed  Orientation:  Full (Time, Place, and Person)  Thought Content:  Logical  Suicidal Thoughts:  No  Homicidal Thoughts:  No  Memory:  Immediate;   NA  Judgement:  Good  Insight:  Good  Psychomotor Activity:  Normal  Concentration:  Concentration: Fair  Recall:  NA  Fund of Knowledge:Good  Language: Good  Akathisia:  Negative  Handed:  Right  AIMS (if indicated):  n/a  Assets:  Communication Skills Desire for Improvement Financial Resources/Insurance Housing Leisure Time Social Support Transportation Vocational/Educational  ADL's:  Intact  Cognition: WNL  Sleep:  4-8 hours, tossing and turning    Treatment Plan Summary: PAMLEA FINDER is a 56 year old female with significant obesity, BMI above 70, hypertension, chronic back pain and 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 She has a history of depressive episodes 20 years ago, and was briefly on Prozac for less than one month in the 1990s. She has no history of consistent SSRI use for more than 2 months. She does not present with any symptoms of mania or psychosis. She presents largely with atypical major depressive symptoms, largely psychomotor retardation, fatigue, low energy, stress eating, tearfulness. She has some  associated symptoms of panic and anxiety reflecting on her current health and family circumstances. I believe she would benefit from a trial on Wellbutrin for a more activating antidepressant effect, with gabapentin 3 times daily for anxiety reduction and to help with her chronic pain symptoms.  She is also willing to engage in regular therapy and will arrange intake with a provider at this clinic.  1. Moderate episode of recurrent major depressive disorder (HCC)    - Initiate Wellbutrin 150 mg XL daily, patient does not have any history of seizures - Initiate gabapentin 300 mg twice daily and 600 mg at night for sleep - Patient to follow-up with writer in 8  weeks - Arrange for therapy intake at this clinic - Patient is connected with her primary care, and they are aware of her back pain and changes in bowel and bladder function  Aundra Dubin, MD 3/22/201810:07 AM

## 2016-06-16 NOTE — Patient Instructions (Addendum)
Take Gabapentin 1 capsule in the morning, 1 capsule at lunchtime, and 2 capsules at bedtime  Take Wellbutrin 150 mg XL in the morning  Return in 8 weeks   Bupropion extended-release tablets (Depression/Mood Disorders) What is this medicine? BUPROPION (byoo PROE pee on) is used to treat depression. This medicine may be used for other purposes; ask your health care provider or pharmacist if you have questions. COMMON BRAND NAME(S): Aplenzin, Budeprion XL, Forfivo XL, Wellbutrin XL What should I tell my health care provider before I take this medicine? They need to know if you have any of these conditions: -an eating disorder, such as anorexia or bulimia -bipolar disorder or psychosis -diabetes or high blood sugar, treated with medication -glaucoma -head injury or brain tumor -heart disease, previous heart attack, or irregular heart beat -high blood pressure -kidney or liver disease -seizures (convulsions) -suicidal thoughts or a previous suicide attempt -Tourette's syndrome -weight loss -an unusual or allergic reaction to bupropion, other medicines, foods, dyes, or preservatives -breast-feeding -pregnant or trying to become pregnant How should I use this medicine? Take this medicine by mouth with a glass of water. Follow the directions on the prescription label. You can take it with or without food. If it upsets your stomach, take it with food. Do not crush, chew, or cut these tablets. This medicine is taken once daily at the same time each day. Do not take your medicine more often than directed. Do not stop taking this medicine suddenly except upon the advice of your doctor. Stopping this medicine too quickly may cause serious side effects or your condition may worsen. A special MedGuide will be given to you by the pharmacist with each prescription and refill. Be sure to read this information carefully each time. Talk to your pediatrician regarding the use of this medicine in children.  Special care may be needed. Overdosage: If you think you have taken too much of this medicine contact a poison control center or emergency room at once. NOTE: This medicine is only for you. Do not share this medicine with others. What if I miss a dose? If you miss a dose, skip the missed dose and take your next tablet at the regular time. Do not take double or extra doses. What may interact with this medicine? Do not take this medicine with any of the following medications: -linezolid -MAOIs like Azilect, Carbex, Eldepryl, Marplan, Nardil, and Parnate -methylene blue (injected into a vein) -other medicines that contain bupropion like Zyban This medicine may also interact with the following medications: -alcohol -certain medicines for anxiety or sleep -certain medicines for blood pressure like metoprolol, propranolol -certain medicines for depression or psychotic disturbances -certain medicines for HIV or AIDS like efavirenz, lopinavir, nelfinavir, ritonavir -certain medicines for irregular heart beat like propafenone, flecainide -certain medicines for Parkinson's disease like amantadine, levodopa -certain medicines for seizures like carbamazepine, phenytoin, phenobarbital -cimetidine -clopidogrel -cyclophosphamide -digoxin -furazolidone -isoniazid -nicotine -orphenadrine -procarbazine -steroid medicines like prednisone or cortisone -stimulant medicines for attention disorders, weight loss, or to stay awake -tamoxifen -theophylline -thiotepa -ticlopidine -tramadol -warfarin This list may not describe all possible interactions. Give your health care provider a list of all the medicines, herbs, non-prescription drugs, or dietary supplements you use. Also tell them if you smoke, drink alcohol, or use illegal drugs. Some items may interact with your medicine. What should I watch for while using this medicine? Tell your doctor if your symptoms do not get better or if they get worse.  Visit your doctor  or health care professional for regular checks on your progress. Because it may take several weeks to see the full effects of this medicine, it is important to continue your treatment as prescribed by your doctor. Patients and their families should watch out for new or worsening thoughts of suicide or depression. Also watch out for sudden changes in feelings such as feeling anxious, agitated, panicky, irritable, hostile, aggressive, impulsive, severely restless, overly excited and hyperactive, or not being able to sleep. If this happens, especially at the beginning of treatment or after a change in dose, call your health care professional. Avoid alcoholic drinks while taking this medicine. Drinking large amounts of alcoholic beverages, using sleeping or anxiety medicines, or quickly stopping the use of these agents while taking this medicine may increase your risk for a seizure. Do not drive or use heavy machinery until you know how this medicine affects you. This medicine can impair your ability to perform these tasks. Do not take this medicine close to bedtime. It may prevent you from sleeping. Your mouth may get dry. Chewing sugarless gum or sucking hard candy, and drinking plenty of water may help. Contact your doctor if the problem does not go away or is severe. The tablet shell for some brands of this medicine does not dissolve. This is normal. The tablet shell may appear whole in the stool. This is not a cause for concern. What side effects may I notice from receiving this medicine? Side effects that you should report to your doctor or health care professional as soon as possible: -allergic reactions like skin rash, itching or hives, swelling of the face, lips, or tongue -breathing problems -changes in vision -confusion -elevated mood, decreased need for sleep, racing thoughts, impulsive behavior -fast or irregular heartbeat -hallucinations, loss of contact with  reality -increased blood pressure -redness, blistering, peeling or loosening of the skin, including inside the mouth -seizures -suicidal thoughts or other mood changes -unusually weak or tired -vomiting Side effects that usually do not require medical attention (report to your doctor or health care professional if they continue or are bothersome): -constipation -headache -loss of appetite -nausea -tremors -weight loss This list may not describe all possible side effects. Call your doctor for medical advice about side effects. You may report side effects to FDA at 1-800-FDA-1088. Where should I keep my medicine? Keep out of the reach of children. Store at room temperature between 15 and 30 degrees C (59 and 86 degrees F). Throw away any unused medicine after the expiration date. NOTE: This sheet is a summary. It may not cover all possible information. If you have questions about this medicine, talk to your doctor, pharmacist, or health care provider.  2018 Elsevier/Gold Standard (2015-09-04 13:55:13)      Gabapentin capsules or tablets What is this medicine? GABAPENTIN (GA ba pen tin) is used to control partial seizures in adults with epilepsy. It is also used to treat certain types of nerve pain. This medicine may be used for other purposes; ask your health care provider or pharmacist if you have questions. COMMON BRAND NAME(S): Active-PAC with Gabapentin, Gabarone, Neurontin What should I tell my health care provider before I take this medicine? They need to know if you have any of these conditions: -kidney disease -suicidal thoughts, plans, or attempt; a previous suicide attempt by you or a family member -an unusual or allergic reaction to gabapentin, other medicines, foods, dyes, or preservatives -pregnant or trying to get pregnant -breast-feeding How should I use this  medicine? Take this medicine by mouth with a glass of water. Follow the directions on the prescription  label. You can take it with or without food. If it upsets your stomach, take it with food.Take your medicine at regular intervals. Do not take it more often than directed. Do not stop taking except on your doctor's advice. If you are directed to break the 600 or 800 mg tablets in half as part of your dose, the extra half tablet should be used for the next dose. If you have not used the extra half tablet within 28 days, it should be thrown away. A special MedGuide will be given to you by the pharmacist with each prescription and refill. Be sure to read this information carefully each time. Talk to your pediatrician regarding the use of this medicine in children. Special care may be needed. Overdosage: If you think you have taken too much of this medicine contact a poison control center or emergency room at once. NOTE: This medicine is only for you. Do not share this medicine with others. What if I miss a dose? If you miss a dose, take it as soon as you can. If it is almost time for your next dose, take only that dose. Do not take double or extra doses. What may interact with this medicine? Do not take this medicine with any of the following medications: -other gabapentin products This medicine may also interact with the following medications: -alcohol -antacids -antihistamines for allergy, cough and cold -certain medicines for anxiety or sleep -certain medicines for depression or psychotic disturbances -homatropine; hydrocodone -naproxen -narcotic medicines (opiates) for pain -phenothiazines like chlorpromazine, mesoridazine, prochlorperazine, thioridazine This list may not describe all possible interactions. Give your health care provider a list of all the medicines, herbs, non-prescription drugs, or dietary supplements you use. Also tell them if you smoke, drink alcohol, or use illegal drugs. Some items may interact with your medicine. What should I watch for while using this medicine? Visit  your doctor or health care professional for regular checks on your progress. You may want to keep a record at home of how you feel your condition is responding to treatment. You may want to share this information with your doctor or health care professional at each visit. You should contact your doctor or health care professional if your seizures get worse or if you have any new types of seizures. Do not stop taking this medicine or any of your seizure medicines unless instructed by your doctor or health care professional. Stopping your medicine suddenly can increase your seizures or their severity. Wear a medical identification bracelet or chain if you are taking this medicine for seizures, and carry a card that lists all your medications. You may get drowsy, dizzy, or have blurred vision. Do not drive, use machinery, or do anything that needs mental alertness until you know how this medicine affects you. To reduce dizzy or fainting spells, do not sit or stand up quickly, especially if you are an older patient. Alcohol can increase drowsiness and dizziness. Avoid alcoholic drinks. Your mouth may get dry. Chewing sugarless gum or sucking hard candy, and drinking plenty of water will help. The use of this medicine may increase the chance of suicidal thoughts or actions. Pay special attention to how you are responding while on this medicine. Any worsening of mood, or thoughts of suicide or dying should be reported to your health care professional right away. Women who become pregnant while using this medicine  may enroll in the [place] Pregnancy Registry by calling [phone]. This registry collects information about the safety of antiepileptic drug use during pregnancy. What side effects may I notice from receiving this medicine? Side effects that you should report to your doctor or health care professional as soon as possible: -allergic reactions like skin rash, itching or hives,  swelling of the face, lips, or tongue -worsening of mood, thoughts or actions of suicide or dying Side effects that usually do not require medical attention (report to your doctor or health care professional if they continue or are bothersome): -constipation -difficulty walking or controlling muscle movements -dizziness -nausea -slurred speech -tiredness -tremors -weight gain This list may not describe all possible side effects. Call your doctor for medical advice about side effects. You may report side effects to FDA at 1-800-FDA-1088. Where should I keep my medicine? Keep out of reach of children. This medicine may cause accidental overdose and death if it taken by other adults, children, or pets. Mix any unused medicine with a substance like cat litter or coffee grounds. Then throw the medicine away in a sealed container like a sealed bag or a coffee can with a lid. Do not use the medicine after the expiration date. Store at room temperature between 15 and 30 degrees C (59 and 86 degrees F). NOTE: This sheet is a summary. It may not cover all possible information. If you have questions about this medicine, talk to your doctor, pharmacist, or health care provider.  2018 Elsevier/Gold Standard (2013-05-10 15:26:50)

## 2016-06-27 ENCOUNTER — Encounter: Payer: Self-pay | Admitting: Physical Medicine & Rehabilitation

## 2016-06-27 ENCOUNTER — Ambulatory Visit (HOSPITAL_BASED_OUTPATIENT_CLINIC_OR_DEPARTMENT_OTHER): Payer: No Typology Code available for payment source | Admitting: Physical Medicine & Rehabilitation

## 2016-06-27 ENCOUNTER — Encounter: Payer: No Typology Code available for payment source | Attending: Physical Medicine & Rehabilitation

## 2016-06-27 VITALS — BP 143/82 | HR 74 | Resp 14

## 2016-06-27 DIAGNOSIS — M6528 Calcific tendinitis, other site: Secondary | ICD-10-CM

## 2016-06-27 DIAGNOSIS — M47816 Spondylosis without myelopathy or radiculopathy, lumbar region: Secondary | ICD-10-CM | POA: Diagnosis not present

## 2016-06-27 DIAGNOSIS — M545 Low back pain: Secondary | ICD-10-CM | POA: Diagnosis not present

## 2016-06-27 DIAGNOSIS — M7661 Achilles tendinitis, right leg: Secondary | ICD-10-CM | POA: Insufficient documentation

## 2016-06-27 DIAGNOSIS — G4730 Sleep apnea, unspecified: Secondary | ICD-10-CM | POA: Insufficient documentation

## 2016-06-27 DIAGNOSIS — M25562 Pain in left knee: Secondary | ICD-10-CM | POA: Diagnosis not present

## 2016-06-27 DIAGNOSIS — G8929 Other chronic pain: Secondary | ICD-10-CM

## 2016-06-27 DIAGNOSIS — IMO0001 Reserved for inherently not codable concepts without codable children: Secondary | ICD-10-CM

## 2016-06-27 DIAGNOSIS — F329 Major depressive disorder, single episode, unspecified: Secondary | ICD-10-CM | POA: Insufficient documentation

## 2016-06-27 DIAGNOSIS — Z9884 Bariatric surgery status: Secondary | ICD-10-CM | POA: Insufficient documentation

## 2016-06-27 DIAGNOSIS — Z6841 Body Mass Index (BMI) 40.0 and over, adult: Secondary | ICD-10-CM | POA: Diagnosis not present

## 2016-06-27 DIAGNOSIS — F418 Other specified anxiety disorders: Secondary | ICD-10-CM | POA: Diagnosis not present

## 2016-06-27 DIAGNOSIS — Z8249 Family history of ischemic heart disease and other diseases of the circulatory system: Secondary | ICD-10-CM | POA: Insufficient documentation

## 2016-06-27 DIAGNOSIS — E6609 Other obesity due to excess calories: Secondary | ICD-10-CM

## 2016-06-27 DIAGNOSIS — R159 Full incontinence of feces: Secondary | ICD-10-CM | POA: Diagnosis not present

## 2016-06-27 DIAGNOSIS — M549 Dorsalgia, unspecified: Secondary | ICD-10-CM | POA: Diagnosis present

## 2016-06-27 DIAGNOSIS — M25561 Pain in right knee: Secondary | ICD-10-CM

## 2016-06-27 DIAGNOSIS — K219 Gastro-esophageal reflux disease without esophagitis: Secondary | ICD-10-CM | POA: Insufficient documentation

## 2016-06-27 DIAGNOSIS — G894 Chronic pain syndrome: Secondary | ICD-10-CM | POA: Insufficient documentation

## 2016-06-27 DIAGNOSIS — M48061 Spinal stenosis, lumbar region without neurogenic claudication: Secondary | ICD-10-CM | POA: Insufficient documentation

## 2016-06-27 MED ORDER — MELOXICAM 7.5 MG PO TABS: 15.0000 mg | ORAL_TABLET | Freq: Every day | ORAL | 1 refills | Status: DC

## 2016-06-27 NOTE — Patient Instructions (Signed)
Will do ultrasound guided injection of the knees  We will doa fluoroscopic guided injection of the back
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
## 2016-07-13 ENCOUNTER — Ambulatory Visit
Admission: RE | Admit: 2016-07-13 | Discharge: 2016-07-13 | Disposition: A | Discharge status: Home / Self Care | Payer: No Typology Code available for payment source | Source: Ambulatory Visit | Attending: Physical Medicine & Rehabilitation | Admitting: Physical Medicine & Rehabilitation

## 2016-07-13 DIAGNOSIS — G8929 Other chronic pain: Secondary | ICD-10-CM

## 2016-07-13 DIAGNOSIS — M25562 Pain in left knee: Principal | ICD-10-CM

## 2016-07-13 DIAGNOSIS — M25561 Pain in right knee: Principal | ICD-10-CM

## 2016-07-22 ENCOUNTER — Ambulatory Visit: Payer: Self-pay | Admitting: Registered"

## 2016-07-25 ENCOUNTER — Ambulatory Visit (HOSPITAL_BASED_OUTPATIENT_CLINIC_OR_DEPARTMENT_OTHER): Payer: No Typology Code available for payment source | Admitting: Physical Medicine & Rehabilitation

## 2016-07-25 ENCOUNTER — Encounter: Payer: Self-pay | Admitting: Physical Medicine & Rehabilitation

## 2016-07-25 VITALS — BP 149/83 | HR 71 | Resp 14

## 2016-07-25 DIAGNOSIS — F4323 Adjustment disorder with mixed anxiety and depressed mood: Secondary | ICD-10-CM

## 2016-07-25 DIAGNOSIS — M170 Bilateral primary osteoarthritis of knee: Secondary | ICD-10-CM

## 2016-07-25 DIAGNOSIS — G894 Chronic pain syndrome: Secondary | ICD-10-CM | POA: Diagnosis not present

## 2016-07-25 NOTE — Progress Notes (Signed)
Knee injection With ultrasound guidance  Indication:Bilateral Knee pain not relieved by medication management and other conservative care. Xrays reviewed bilateral tricompartmental OA moderate  Informed consent was obtained after describing risks and benefits of the procedure with the patient, this includes bleeding, bruising, infection and medication side effects. The patient wishes to proceed and has given written consent. The patient was placed in a recumbent position. The medial aspect of the knee was marked and prepped with Betadine and alcohol. It was then entered with a 25-gauge 1-1/2 inch needle and 1 mL of 1% lidocaine was injected into the skin and subcutaneous tissue. Then another 22g 36mm ECHO bloc needle was inserted into the Right  knee joint underdirect [place] visualization using 4Hz  curvilinear transducer. After negative draw back for blood, a solution containing one ML of 6mg  per mL betamethasone and 3 mL of 1% lidocaine were injected. Same procedure repeated on Left side.  The patient tolerated the procedure well. Post procedure instructions were given.

## 2016-07-26 ENCOUNTER — Encounter: Payer: No Typology Code available for payment source | Attending: Psychology | Admitting: Psychology

## 2016-07-26 DIAGNOSIS — F329 Major depressive disorder, single episode, unspecified: Secondary | ICD-10-CM | POA: Diagnosis not present

## 2016-07-26 DIAGNOSIS — R159 Full incontinence of feces: Secondary | ICD-10-CM | POA: Insufficient documentation

## 2016-07-26 DIAGNOSIS — M47816 Spondylosis without myelopathy or radiculopathy, lumbar region: Secondary | ICD-10-CM | POA: Insufficient documentation

## 2016-07-26 DIAGNOSIS — M25562 Pain in left knee: Secondary | ICD-10-CM | POA: Insufficient documentation

## 2016-07-26 DIAGNOSIS — Z8249 Family history of ischemic heart disease and other diseases of the circulatory system: Secondary | ICD-10-CM | POA: Insufficient documentation

## 2016-07-26 DIAGNOSIS — F331 Major depressive disorder, recurrent, moderate: Secondary | ICD-10-CM

## 2016-07-26 DIAGNOSIS — Z9884 Bariatric surgery status: Secondary | ICD-10-CM | POA: Insufficient documentation

## 2016-07-26 DIAGNOSIS — G894 Chronic pain syndrome: Secondary | ICD-10-CM | POA: Diagnosis not present

## 2016-07-26 DIAGNOSIS — M48061 Spinal stenosis, lumbar region without neurogenic claudication: Secondary | ICD-10-CM | POA: Insufficient documentation

## 2016-07-26 DIAGNOSIS — K219 Gastro-esophageal reflux disease without esophagitis: Secondary | ICD-10-CM | POA: Insufficient documentation

## 2016-07-26 DIAGNOSIS — M25561 Pain in right knee: Secondary | ICD-10-CM | POA: Insufficient documentation

## 2016-07-26 DIAGNOSIS — G4730 Sleep apnea, unspecified: Secondary | ICD-10-CM | POA: Insufficient documentation

## 2016-07-26 DIAGNOSIS — M549 Dorsalgia, unspecified: Secondary | ICD-10-CM | POA: Diagnosis present

## 2016-08-08 ENCOUNTER — Telehealth: Payer: Self-pay | Admitting: Physical Medicine & Rehabilitation

## 2016-08-08 NOTE — Telephone Encounter (Signed)
AK received req regarding patient being put out of work - we cannot send notes as dr Letta Pate did not put her out of work visit was 04.30.18-next 06.01.18 - but cannot provide documentation regarding out of work

## 2016-08-11 ENCOUNTER — Ambulatory Visit (HOSPITAL_COMMUNITY): Payer: Self-pay | Admitting: Psychiatry

## 2016-08-15 ENCOUNTER — Encounter: Payer: Self-pay | Admitting: Psychology

## 2016-08-15 ENCOUNTER — Ambulatory Visit: Payer: Self-pay | Admitting: Registered"
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
## 2016-08-16 ENCOUNTER — Encounter: Payer: No Typology Code available for payment source | Admitting: Psychology

## 2016-08-19 ENCOUNTER — Ambulatory Visit (HOSPITAL_COMMUNITY): Payer: Self-pay | Admitting: Psychiatry

## 2016-08-26 ENCOUNTER — Encounter: Payer: No Typology Code available for payment source | Attending: Psychology

## 2016-08-26 ENCOUNTER — Ambulatory Visit (HOSPITAL_BASED_OUTPATIENT_CLINIC_OR_DEPARTMENT_OTHER): Payer: No Typology Code available for payment source | Admitting: Physical Medicine & Rehabilitation

## 2016-08-26 ENCOUNTER — Encounter: Payer: Self-pay | Admitting: Physical Medicine & Rehabilitation

## 2016-08-26 VITALS — BP 138/70 | HR 87 | Resp 16

## 2016-08-26 DIAGNOSIS — G4730 Sleep apnea, unspecified: Secondary | ICD-10-CM | POA: Diagnosis not present

## 2016-08-26 DIAGNOSIS — M549 Dorsalgia, unspecified: Secondary | ICD-10-CM | POA: Diagnosis present

## 2016-08-26 DIAGNOSIS — Z9884 Bariatric surgery status: Secondary | ICD-10-CM | POA: Diagnosis not present

## 2016-08-26 DIAGNOSIS — M25561 Pain in right knee: Secondary | ICD-10-CM

## 2016-08-26 DIAGNOSIS — G894 Chronic pain syndrome: Secondary | ICD-10-CM | POA: Insufficient documentation

## 2016-08-26 DIAGNOSIS — M47816 Spondylosis without myelopathy or radiculopathy, lumbar region: Secondary | ICD-10-CM | POA: Insufficient documentation

## 2016-08-26 DIAGNOSIS — M25562 Pain in left knee: Secondary | ICD-10-CM | POA: Insufficient documentation

## 2016-08-26 DIAGNOSIS — K219 Gastro-esophageal reflux disease without esophagitis: Secondary | ICD-10-CM | POA: Insufficient documentation

## 2016-08-26 DIAGNOSIS — F329 Major depressive disorder, single episode, unspecified: Secondary | ICD-10-CM | POA: Insufficient documentation

## 2016-08-26 DIAGNOSIS — R159 Full incontinence of feces: Secondary | ICD-10-CM | POA: Insufficient documentation

## 2016-08-26 DIAGNOSIS — G8929 Other chronic pain: Secondary | ICD-10-CM | POA: Diagnosis not present

## 2016-08-26 DIAGNOSIS — M48061 Spinal stenosis, lumbar region without neurogenic claudication: Secondary | ICD-10-CM | POA: Insufficient documentation

## 2016-08-26 DIAGNOSIS — Z8249 Family history of ischemic heart disease and other diseases of the circulatory system: Secondary | ICD-10-CM | POA: Diagnosis not present

## 2016-08-26 MED ORDER — MELOXICAM 15 MG PO TABS: 15.0000 mg | ORAL_TABLET | Freq: Every day | ORAL | 2 refills | Status: DC

## 2016-08-26 NOTE — Progress Notes (Signed)
Subjective:    Patient ID: Erika Bennett, female    DOB: May 12, 1960, 56 y.o.   MRN: [id number]  HPI 56 year old female with bilateral knee osteoarthritis. She has morbid obesity. She also has chronic low back pain and some mild to moderate degenerative changes in the lower lumbar area. She has no evidence of lumbar stenosis on imaging studies.  She received bilateral Celestone injections which she states were not very helpful for her. We discussed Synvisc as a next step. Has never tried this before.  Also was scheduled for medial branch blocks of the lumbar spine, did not receive insurance approval yet.  She states she needs some insurance forms filled out. She asked for our office to fill out, but we never took her out of work.   Pain Inventory Average Pain 10 Pain Right Now 10 My pain is constant, tingling and aching  In the last 24 hours, has pain interfered with the following? General activity 10 Relation with others 10 Enjoyment of life 10 What TIME of day is your pain at its worst? morning, daytime, night Sleep (in general) Poor  Pain is worse with: walking, bending, sitting, inactivity, standing and some activites Pain improves with: medication Relief from Meds: 2  Mobility walk with assistance use a cane how many minutes can you walk? 1-2 ability to climb steps?  no Do you have any goals in this area?  yes  Function not employed: date last employed . disabled: date disabled . I need assistance with the following:  dressing, bathing, meal prep, household duties and shopping Do you have any goals in this area?  no  Neuro/Psych bladder control problems bowel control problems weakness numbness tremor tingling trouble walking confusion depression anxiety  Prior Studies Any changes since last visit?  no x-rays CT/MRI  Physicians involved in your care Any changes since last visit?  no   Family History  Problem Relation Age of Onset  . Heart disease  Father        heart attacks  . Cancer Brother        prostate   Social History   Social History  . Marital status: Widowed    Spouse name: N/A  . Number of children: N/A  . Years of education: N/A   Social History Main Topics  . Smoking status: Never Smoker  . Smokeless tobacco: Never Used  . Alcohol use No     Comment: 2-3 drinks week  . Drug use: No  . Sexual activity: Not Currently   Other Topics Concern  . None   Social History Narrative  . None   Past Surgical History:  Procedure Laterality Date  . ABDOMINAL HYSTERECTOMY  07/22/2011   Procedure: HYSTERECTOMY ABDOMINAL;  Surgeon: Lahoma Crocker, MD;  Location: [facility];  Service: Gynecology;;  . abscess peri rectal     I&d  . DILATION AND CURETTAGE OF UTERUS    . GASTRIC BYPASS  2001/07/10  . peri rectal    . TUBAL LIGATION     Past Medical History:  Diagnosis Date  . Abscess of perineum   . Anemia    states bleeding vaginally since 2/13  . Arthritis   . Blood transfusion    age 29 months  . Depression    controlled/ states death of husband 07/10/09  . GERD (gastroesophageal reflux disease)   . Heart murmur   . Obesity   . Sleep apnea    last used CPAP 07/11/2002   BP 138/70 (  BP Location: Left Wrist, Patient Position: Sitting, Cuff Size: Normal)   Pulse 87   Resp 16   LMP 07/17/2011   SpO2 98%   Opioid Risk Score:   Fall Risk Score:  `1  Depression screen PHQ 2/9  Depression screen PHQ 2/9 06/27/2016  Decreased Interest 3  Down, Depressed, Hopeless 3  PHQ - 2 Score 6  Altered sleeping 3  Tired, decreased energy 3  Change in appetite 3  Feeling bad or failure about yourself  3  Trouble concentrating 3  Moving slowly or fidgety/restless 3  Suicidal thoughts 1  PHQ-9 Score 25  Difficult doing work/chores Extremely dIfficult    Review of Systems  Constitutional: Positive for chills, diaphoresis, fever and unexpected weight change.  HENT: Negative.   Eyes: Negative.   Respiratory: Positive for apnea  and shortness of breath.   Cardiovascular: Positive for leg swelling.  Gastrointestinal: Positive for diarrhea and nausea.  Endocrine: Negative.   Genitourinary: Positive for difficulty urinating.  Musculoskeletal: Positive for arthralgias, back pain, gait problem and myalgias.  Skin: Negative.   Allergic/Immunologic: Negative.   Neurological: Positive for tremors, weakness and numbness.       Tingling   Psychiatric/Behavioral: Positive for confusion. The patient is nervous/anxious.        Objective:   Physical Exam  Constitutional: She is oriented to person, place, and time. She appears well-developed and well-nourished.  HENT:  Head: Normocephalic and atraumatic.  Eyes: Conjunctivae and EOM are normal. Pupils are equal, round, and reactive to light.  Musculoskeletal:       Right knee: She exhibits decreased range of motion. She exhibits no swelling. No tenderness found.       Left knee: She exhibits decreased range of motion. She exhibits no effusion. No tenderness found.       Lumbar back: She exhibits decreased range of motion and tenderness. She exhibits no spasm.  Negative straight leg raising. Lumbar range of motion is approximately 50% flexion and extension. She has increased lordosis related to body habitus  Full bilateral knee extension, no ankle deformities, knee flexion is limited by adipose in the popliteal area  Neurological: She is alert and oriented to person, place, and time.  Psychiatric: She has a normal mood and affect.  Nursing note and vitals reviewed.         Assessment & Plan:  1. Bilateral knee osteoarthritis, no significant relief quick steroid injections, we will trial Synvisc. She may not have much of an inflammatory component.  Continue mobic 15 mg a day  2. Lumbar degenerative disc and facet arthrosis. No stenosis. Recommend medial branch blocks  In terms of work, from the standpoint of her lumbar degenerative disc and her knee  osteoarthritis. I do think she is capable of sedentary work. Other physicians will need to correlate her other issues with work status.

## 2016-08-26 NOTE — Patient Instructions (Addendum)
Will try to get Back injections approved

## 2016-09-12 ENCOUNTER — Telehealth: Payer: Self-pay | Admitting: Physical Medicine & Rehabilitation

## 2016-09-12 NOTE — Telephone Encounter (Signed)
Patient is stating that [employer] has sent over a request to get her medical records and they have not been sent to them.  Please call patient to let her know when this is sent.

## 2016-09-20 ENCOUNTER — Telehealth: Payer: Self-pay | Admitting: Physical Medicine & Rehabilitation

## 2016-09-20 NOTE — Telephone Encounter (Signed)
Patient states dr. Letta Pate is not treating her for back why is he documenting  And why did he state she could do sedentary work.  I advised any diagnosis was based on his physical examination and radiology/data already on epic.  His statement is that other physicians reviewing other issues medically may need to speak to any other physical issues other than what he mentions with the knees and possible intervention for back.  Patient does not believe she can do sedentary work and is not please disability has denied. I have advised physician.

## 2016-12-01 ENCOUNTER — Ambulatory Visit: Payer: Self-pay | Admitting: Physical Medicine & Rehabilitation

## 2018-02-22 ENCOUNTER — Encounter (HOSPITAL_COMMUNITY): Payer: Self-pay | Admitting: Emergency Medicine

## 2018-02-22 ENCOUNTER — Emergency Department (HOSPITAL_COMMUNITY)
Admission: EM | Admit: 2018-02-22 | Discharge: 2018-02-22 | Disposition: A | Discharge status: Home / Self Care | Payer: No Typology Code available for payment source | Attending: Emergency Medicine | Admitting: Emergency Medicine

## 2018-02-22 ENCOUNTER — Other Ambulatory Visit: Payer: Self-pay

## 2018-02-22 DIAGNOSIS — B029 Zoster without complications: Secondary | ICD-10-CM

## 2018-02-22 DIAGNOSIS — Z79899 Other long term (current) drug therapy: Secondary | ICD-10-CM | POA: Insufficient documentation

## 2018-02-22 DIAGNOSIS — I10 Essential (primary) hypertension: Secondary | ICD-10-CM | POA: Insufficient documentation

## 2018-02-22 MED ORDER — PREDNISONE 20 MG PO TABS: 40.0000 mg | ORAL_TABLET | Freq: Every day | ORAL | 0 refills | Status: DC

## 2018-02-22 MED ORDER — GABAPENTIN 300 MG PO CAPS: 300.0000 mg | ORAL_CAPSULE | Freq: Three times a day (TID) | ORAL | 0 refills | Status: DC

## 2018-02-22 MED ORDER — VALACYCLOVIR HCL 1 G PO TABS: 1000.0000 mg | ORAL_TABLET | Freq: Three times a day (TID) | ORAL | 0 refills | Status: AC

## 2018-02-22 NOTE — Discharge Instructions (Addendum)
Start valtrex, take until all gone. Prednisone as prescribed until all gone. See attached information. Continue hydrocodone for pain. Take gabapentin for additional pain relief. Follow up with your doctor in 1 week.

## 2018-02-22 NOTE — ED Provider Notes (Signed)
[facility] EMERGENCY DEPARTMENT Provider Note   CSN: [id number] Arrival date & time: 02/22/18  1027     History   Chief Complaint No chief complaint on file.   HPI Erika Bennett is a 57 y.o. female.  HPI Erika Bennett is a 57 y.o. female presents to emergency department with complaint of left-sided chest pain.  Patient states she noticed a rash about 2 days ago, states the rash is very painful.  It wraps around from left side of the back, under the left breast, onto the left side of the abdomen.  She denies any shortness of breath.  Denies any pain with breathing.  Denies any history of the same.  No fever or chills.  She has not tried any medications prior to coming in.  Rash is very tender, blistering.  Past Medical History:  Diagnosis Date  . Abscess of perineum   . Anemia    states bleeding vaginally since 2/13  . Arthritis   . Blood transfusion    age 68 months  . Depression    controlled/ states death of husband 07/16/09  . GERD (gastroesophageal reflux disease)   . Heart murmur   . Obesity   . Sleep apnea    last used CPAP 07-17-2002    Patient Active Problem List   Diagnosis Date Noted  . Spondylosis without myelopathy or radiculopathy, lumbar region 08/26/2016  . Depression with anxiety 06/27/2016  . Chronic pain of both knees 06/27/2016  . Chronic bilateral low back pain without sciatica 06/27/2016  . 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 Annular tear of lumbar disc 05/19/2016  . Class 3 obesity due to excess calories with serious comorbidity and body mass index (BMI) greater than or equal to 70 in adult 05/19/2016  . DDD (degenerative disc disease), lumbar 05/19/2016  . Essential hypertension 02/01/2016  . Chronic pain syndrome 01/04/2016  . Hair loss 01/04/2016  . Sleep disturbance 01/04/2016  . Severe episode of recurrent major depressive disorder ([place]) 01/04/2016  . Weight gain 01/04/2016  . Chondromalacia  patellae 01/05/2012  . Primary localized osteoarthrosis, lower leg 01/05/2012  . Uterine leiomyoma 07/25/2011  . Irregular menstrual cycle 07/18/2011    Past Surgical History:  Procedure Laterality Date  . ABDOMINAL HYSTERECTOMY  07/22/2011   Procedure: HYSTERECTOMY ABDOMINAL;  Surgeon: Lahoma Crocker, MD;  Location: [facility];  Service: Gynecology;;  . abscess peri rectal     I&d  . DILATION AND CURETTAGE OF UTERUS    . GASTRIC BYPASS  07-16-2001  . peri rectal    . TUBAL LIGATION       OB History    Gravida  3   Para  3   Term      Preterm      AB      Living  3     SAB      TAB      Ectopic      Multiple      Live Births               Home Medications    Prior to Admission medications   Medication Sig Start Date End Date Taking? Authorizing Provider  amLODipine (NORVASC) 10 MG tablet Take 10 mg by mouth. 05/19/16 05/19/17  [provider]  buPROPion (WELLBUTRIN XL) 150 MG 24 hr tablet Take 1 tablet (150 mg total) by mouth every morning. 06/16/16 06/16/17  Daron Offer, Richard Miu, MD  Calcium  Carbonate (CALTRATE 600 PO) Take 1 tablet by mouth daily with breakfast.    [provider]  diclofenac (VOLTAREN) 75 MG EC tablet Take 75 mg by mouth 2 (two) times daily.    [provider]  gabapentin (NEURONTIN) 300 MG capsule Take 1 capsule (300 mg total) by mouth 4 (four) times daily. 06/16/16 06/16/17  Aundra Dubin, MD  HYDROcodone-acetaminophen (NORCO/VICODIN) 5-325 MG tablet TAKE 1 TABLET BY MOUTH TWICE DAILY AS NEEDED FOR SEVERE PAIN. 05/20/16   [provider]  meloxicam (MOBIC) 15 MG tablet Take 1 tablet (15 mg total) by mouth daily. 08/26/16   Kirsteins, Luanna Salk, MD  Multiple Vitamin (MULITIVITAMIN WITH MINERALS) TABS Take 1 tablet by mouth daily with breakfast. One-a-day women's multivitamin    [provider]  OVER THE COUNTER MEDICATION Take 2 capsules by mouth as needed. Tylenol arthritis    [provider]    Family History Family History  Problem Relation Age of Onset  . Heart disease Father        heart attacks  . Cancer Brother        prostate    Social History Social History   Tobacco Use  . Smoking status: Never Smoker  . Smokeless tobacco: Never Used  Substance Use Topics  . Alcohol use: No    Comment: 2-3 drinks week  . Drug use: No     Allergies   Patient has no known allergies.   Review of Systems Review of Systems  Constitutional: Negative for chills and fever.  Respiratory: Negative for cough, choking, chest tightness, shortness of breath, wheezing and stridor.   Cardiovascular: Positive for chest pain. Negative for leg swelling.  Gastrointestinal: Negative for abdominal pain.  Skin: Positive for rash.  All other systems reviewed and are negative.    Physical Exam Updated Vital Signs LMP 07/17/2011   Physical Exam  Constitutional: She appears well-developed and well-nourished. No distress.  HENT:  Head: Normocephalic.  Eyes: Conjunctivae are normal.  Neck: Neck supple.  Cardiovascular: Normal rate, regular rhythm and normal heart sounds.  Pulmonary/Chest: Effort normal and breath sounds normal. No respiratory distress. She has no wheezes. She has no rales. She exhibits tenderness.  There is erythematous, papular and vesicular rash that extends from midline of the mid back, and wraps around left flank, left upper abdomen, under the breast, to the midline of the abdomen.  Rash is very tender.  Musculoskeletal: She exhibits no edema.  Neurological: She is alert.  Skin: Skin is warm and dry.  Psychiatric: She has a normal mood and affect. Her behavior is normal.  Nursing note and vitals reviewed.    ED Treatments / Results  Labs (all labs ordered are listed, but only abnormal results are displayed) Labs Reviewed - No data to display  EKG None  Radiology No results found.  Procedures Procedures (including critical care  time)  Medications Ordered in ED Medications - No data to display   Initial Impression / Assessment and Plan / ED Course  I have reviewed the triage vital signs and the nursing notes.  Pertinent labs & imaging results that were available during my care of the patient were reviewed by me and considered in my medical decision making (see chart for details).     Patient with left-sided chest pain and rash which wraps around under her breast into the left flank.  The pain in the chest is just over the rash area.  The rash is vesicular, erythematous,  very tender to the touch.  It is consistent with shingles.  It does not cross midline in the back or in the front.  There is no other rashes over the body.  Patient is afebrile, nontoxic-appearing otherwise.  Lungs are clear.  No respiratory symptoms.  Will treat with Valtrex, prednisone, patient is already on hydrocodone 10 mg, advised her to take it more frequently for pain relief.  I will prescribe gabapentin.  Follow-up with family doctor for recheck.  I do not think patient needs any further testing or imaging in the emergency department.  She is stable for discharge home.  Vitals:   02/22/18 0939  BP: (!) 160/66  Pulse: 85  Resp: 16  Temp: 99.8 F (37.7 C)  TempSrc: Oral  SpO2: 100%  Weight: (!) 195 kg  Height: 5\' 4"  (1.626 m)     Final Clinical Impressions(s) / ED Diagnoses   Final diagnoses:  Herpes zoster without complication    ED Discharge Orders         Ordered    valACYclovir (VALTREX) 1000 MG tablet  3 times daily     02/22/18 0942    predniSONE (DELTASONE) 20 MG tablet  Daily     02/22/18 0942    gabapentin (NEURONTIN) 300 MG capsule  3 times daily     02/22/18 0942           Jeannett Senior, PA-C 02/22/18 7342    Mesner, Corene Cornea, MD 02/22/18 1006

## 2018-02-22 NOTE — ED Triage Notes (Signed)
Pt in from home with c/o rash with closed blisters to L lateral abdomen reaching to anterior/posterior abdomen. States rash is painful and pain reaches up to chest. Has not had shingles vaccine

## 2020-03-23 ENCOUNTER — Encounter (HOSPITAL_COMMUNITY): Payer: Self-pay

## 2020-03-23 ENCOUNTER — Emergency Department (HOSPITAL_COMMUNITY)
Admission: EM | Admit: 2020-03-23 | Discharge: 2020-03-23 | Disposition: A | Discharge status: Home / Self Care | Payer: Medicare (Managed Care) | Attending: Emergency Medicine | Admitting: Emergency Medicine

## 2020-03-23 ENCOUNTER — Emergency Department (HOSPITAL_COMMUNITY): Payer: Medicare (Managed Care)

## 2020-03-23 ENCOUNTER — Other Ambulatory Visit: Payer: Self-pay

## 2020-03-23 DIAGNOSIS — K219 Gastro-esophageal reflux disease without esophagitis: Secondary | ICD-10-CM | POA: Diagnosis not present

## 2020-03-23 DIAGNOSIS — R1031 Right lower quadrant pain: Secondary | ICD-10-CM | POA: Diagnosis not present

## 2020-03-23 DIAGNOSIS — R197 Diarrhea, unspecified: Secondary | ICD-10-CM

## 2020-03-23 DIAGNOSIS — I10 Essential (primary) hypertension: Secondary | ICD-10-CM | POA: Diagnosis not present

## 2020-03-23 DIAGNOSIS — Z79899 Other long term (current) drug therapy: Secondary | ICD-10-CM | POA: Insufficient documentation

## 2020-03-23 LAB — CBC WITH DIFFERENTIAL/PLATELET
Abs Immature Granulocytes: 0.02 10*3/uL (ref 0.00–0.07)
Basophils Absolute: 0.1 10*3/uL (ref 0.0–0.1)
Basophils Relative: 1 %
Eosinophils Absolute: 0.1 10*3/uL (ref 0.0–0.5)
Eosinophils Relative: 2 %
HCT: 34.8 % — ABNORMAL LOW (ref 36.0–46.0)
Hemoglobin: 10.7 g/dL — ABNORMAL LOW (ref 12.0–15.0)
Immature Granulocytes: 0 %
Lymphocytes Relative: 26 %
Lymphs Abs: 2.1 10*3/uL (ref 0.7–4.0)
MCH: 26.1 pg (ref 26.0–34.0)
MCHC: 30.7 g/dL (ref 30.0–36.0)
MCV: 84.9 fL (ref 80.0–100.0)
Monocytes Absolute: 1 10*3/uL (ref 0.1–1.0)
Monocytes Relative: 13 %
Neutro Abs: 4.6 10*3/uL (ref 1.7–7.7)
Neutrophils Relative %: 58 %
Platelets: 340 10*3/uL (ref 150–400)
RBC: 4.1 MIL/uL (ref 3.87–5.11)
RDW: 21.1 % — ABNORMAL HIGH (ref 11.5–15.5)
WBC: 7.9 10*3/uL (ref 4.0–10.5)
nRBC: 0 % (ref 0.0–0.2)

## 2020-03-23 LAB — COMPREHENSIVE METABOLIC PANEL
ALT: 15 U/L (ref 0–44)
AST: 19 U/L (ref 15–41)
Albumin: 3.4 g/dL — ABNORMAL LOW (ref 3.5–5.0)
Alkaline Phosphatase: 76 U/L (ref 38–126)
Anion gap: 7 (ref 5–15)
BUN: 7 mg/dL (ref 6–20)
CO2: 27 mmol/L (ref 22–32)
Calcium: 8.8 mg/dL — ABNORMAL LOW (ref 8.9–10.3)
Chloride: 108 mmol/L (ref 98–111)
Creatinine, Ser: 0.66 mg/dL (ref 0.44–1.00)
GFR, Estimated: >60 mL/min (ref >60)
Glucose, Bld: 107 mg/dL — ABNORMAL HIGH (ref 70–99)
Potassium: 4 mmol/L (ref 3.5–5.1)
Sodium: 142 mmol/L (ref 135–145)
Total Bilirubin: 0.2 mg/dL — ABNORMAL LOW (ref 0.3–1.2)
Total Protein: 6.8 g/dL (ref 6.5–8.1)

## 2020-03-23 LAB — URINALYSIS, ROUTINE W REFLEX MICROSCOPIC
Bacteria, UA: NONE SEEN
Bilirubin Urine: NEGATIVE
Glucose, UA: NEGATIVE mg/dL
Hgb urine dipstick: NEGATIVE
Ketones, ur: NEGATIVE mg/dL
Nitrite: NEGATIVE
Protein, ur: NEGATIVE mg/dL
Specific Gravity, Urine: 1.005 (ref 1.005–1.030)
pH: 7 (ref 5.0–8.0)

## 2020-03-23 LAB — LIPASE, BLOOD: Lipase: 16 U/L (ref 11–51)

## 2020-03-23 MED ORDER — ONDANSETRON HCL 4 MG/2ML IJ SOLN
4.0000 mg | Freq: Once | INTRAMUSCULAR | Status: AC
  Administered 2020-03-23: 4 mg via INTRAVENOUS
  Filled 2020-03-23: qty 2

## 2020-03-23 MED ORDER — MORPHINE SULFATE (PF) 4 MG/ML IV SOLN
6.0000 mg | Freq: Once | INTRAVENOUS | Status: AC
  Administered 2020-03-23: 6 mg via INTRAVENOUS
  Filled 2020-03-23: qty 2

## 2020-03-23 MED ORDER — IOHEXOL 300 MG/ML  SOLN
100.0000 mL | Freq: Once | INTRAMUSCULAR | Status: AC | PRN
  Administered 2020-03-23: 100 mL via INTRAVENOUS

## 2020-03-23 MED ORDER — FENTANYL CITRATE (PF) 100 MCG/2ML IJ SOLN
100.0000 ug | Freq: Once | INTRAMUSCULAR | Status: AC
  Administered 2020-03-23: 100 ug via INTRAVENOUS
  Filled 2020-03-23: qty 2

## 2020-03-23 MED ORDER — CYCLOBENZAPRINE HCL 10 MG PO TABS: 10.0000 mg | ORAL_TABLET | Freq: Three times a day (TID) | ORAL | 0 refills | Status: AC

## 2020-03-23 NOTE — ED Provider Notes (Signed)
[facility] EMERGENCY DEPARTMENT Provider Note   CSN: [id number] Arrival date & time: 03/23/20  0830     History No chief complaint on file.   Erika Bennett is a 59 y.o. female with past medical history of elevated BMI, gastric bypass 2011/07/18, anemia presents to the ED for evaluation of right-sided groin pain that began last Wednesday while she was walking around during Christmas shopping.  The pain was sudden, sharp.  She points to her right hip and inguinal crease.  Occasionally feels like it is radiating to her right low back.  Pain is constant, severe 10/10.  Has had associated nausea, foul-smelling loose diarrhea and flatus.  No blood or melena in stools. Patient takes hydrocodone and gabapentin for chronic back pain and arthritis and reports the medicine makes the pain less sharp but still persists.  It is worse with moving around and walking.  History of hysterectomy.  States they had to leave 1 ovary but does not know on which side.  Menopausal and no menses for several years.  No fever, vomiting, dysuria, hematuria.  No history of kidney stones.  No chest pain, coughing, shortness of breath.  Reports chronic low back pain but no new back pain or changes in her usual pain.  No extremity weakness or tingling.  No saddle anesthesia, bladder or bowel incontinence or retention.  No rash.  States she was recently told her hemoglobin was lower than normal.  She was referred to gastroenterology and is waiting to get a colonoscopy scheduled. Reports long history of anemia and required transfusions in the past. Denies chest pain, shortness of breath, light headedness, syncope.   HPI     Past Medical History:  Diagnosis Date  . Abscess of perineum   . Anemia    states bleeding vaginally since 2/13  . Arthritis   . Blood transfusion    age 39 months  . Depression    controlled/ states death of husband 2009/07/17  . GERD (gastroesophageal reflux disease)   . Heart murmur   .  Obesity   . Sleep apnea    last used CPAP 2002/07/18    Patient Active Problem List   Diagnosis Date Noted  . Spondylosis without myelopathy or radiculopathy, lumbar region 08/26/2016  . Depression with anxiety 06/27/2016  . Chronic pain of both knees 06/27/2016  . Chronic bilateral low back pain without sciatica 06/27/2016  . 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 Annular tear of lumbar disc 05/19/2016  . Class 3 obesity due to excess calories with serious comorbidity and body mass index (BMI) greater than or equal to 70 in adult 05/19/2016  . DDD (degenerative disc disease), lumbar 05/19/2016  . Essential hypertension 02/01/2016  . Chronic pain syndrome 01/04/2016  . Hair loss 01/04/2016  . Sleep disturbance 01/04/2016  . Severe episode of recurrent major depressive disorder ([place]) 01/04/2016  . Weight gain 01/04/2016  . 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 Primary localized osteoarthrosis, lower leg 01/05/2012  . Uterine leiomyoma 07/25/2011  . Irregular menstrual cycle 07/18/2011    Past Surgical History:  Procedure Laterality Date  . ABDOMINAL HYSTERECTOMY  07/22/2011   Procedure: HYSTERECTOMY ABDOMINAL;  Surgeon: Lahoma Crocker, MD;  Location: [facility];  Service: Gynecology;;  . abscess peri rectal     I&d  . DILATION AND CURETTAGE OF UTERUS    . GASTRIC BYPASS  07-17-2001  . peri rectal    . TUBAL LIGATION  OB History    Gravida  3   Para  3   Term      Preterm      AB      Living  3     SAB      IAB      Ectopic      Multiple      Live Births              Family History  Problem Relation Age of Onset  . Heart disease Father        heart attacks  . Cancer Brother        prostate    Social History   Tobacco Use  . Smoking status: Never Smoker  . Smokeless tobacco: Never Used  Substance Use Topics  . Alcohol use: No    Comment: 2-3 drinks week  . Drug use: No    Home Medications Prior to Admission  medications   Medication Sig Start Date End Date Taking? Authorizing Provider  acetaminophen (TYLENOL) 500 MG tablet Take 1,000 mg by mouth 2 (two) times daily as needed (pain).   Yes [provider]  ALPRAZolam Duanne Moron) 0.5 MG tablet Take 0.5 mg by mouth daily as needed for anxiety. 03/13/20  Yes [provider]  amLODipine (NORVASC) 5 MG tablet Take 5 mg by mouth daily. 03/02/20  Yes [provider]  buPROPion (WELLBUTRIN SR) 150 MG 12 hr tablet Take 150 mg by mouth 2 (two) times daily. 11/21/19  Yes [provider]  cyclobenzaprine (FLEXERIL) 10 MG tablet Take 1 tablet (10 mg total) by mouth 3 (three) times daily. 03/23/20 04/22/20 Yes Carmon Sails J, PA-C  gabapentin (NEURONTIN) 300 MG capsule Take 1 capsule (300 mg total) by mouth 3 (three) times daily. 02/22/18  Yes Kirichenko, Tatyana, PA-C  HYDROcodone-acetaminophen (NORCO) 10-325 MG tablet Take 1 tablet by mouth 3 (three) times daily. 03/13/20  Yes [provider]  methocarbamol (ROBAXIN) 500 MG tablet Take 500 mg by mouth every morning. 03/03/20  Yes [provider]    Allergies    Duloxetine  Review of Systems   Review of Systems  Gastrointestinal: Positive for abdominal pain, diarrhea and nausea.       Groin pain   All other systems reviewed and are negative.   Physical Exam Updated Vital Signs BP (!) 179/73 (BP Location: Right Wrist)   Pulse 86   Temp 98.8 F (37.1 C) (Oral)   Resp 20   LMP 07/17/2011   SpO2 98%   Physical Exam Vitals and nursing note reviewed.  Constitutional:      Appearance: She is well-developed.     Comments: NAD  HENT:     Head: Normocephalic and atraumatic.     Nose: Nose normal.  Eyes:     Conjunctiva/sclera: Conjunctivae normal.  Cardiovascular:     Rate and Rhythm: Normal rate and regular rhythm.  Pulmonary:     Effort: Pulmonary effort is normal.     Breath sounds: Normal breath sounds.  Abdominal:     General: Bowel sounds  are normal.     Palpations: Abdomen is soft.     Tenderness: There is abdominal tenderness.     Comments: Obese abdomen limits exam.  RLQ, right inguinal crease tenderness with deep pressure. No obvious guarding, rigidity. No suprapubic or CVA tenderness. Unreliable  Murphy's and McBurney's. Unable to auscultate BS   Musculoskeletal:        General: Normal range of motion.  Cervical back: Normal range of motion.     Comments: No midline lumbar or hip bone prominence tenderness. No pain with hip flexion, IR, ER  Skin:    General: Skin is warm and dry.     Capillary Refill: Capillary refill takes less than 2 seconds.  Neurological:     Mental Status: She is alert.     Comments: Sensation and strength intact in upper/lower extremities   Psychiatric:        Behavior: Behavior normal.     ED Results / Procedures / Treatments   Labs (all labs ordered are listed, but only abnormal results are displayed) Labs Reviewed  COMPREHENSIVE METABOLIC PANEL - Abnormal; Notable for the following components:      Result Value   Glucose, Bld 107 (*)    Calcium 8.8 (*)    Albumin 3.4 (*)    Total Bilirubin 0.2 (*)    All other components within normal limits  CBC WITH DIFFERENTIAL/PLATELET - Abnormal; Notable for the following components:   Hemoglobin 10.7 (*)    HCT 34.8 (*)    RDW 21.1 (*)    All other components within normal limits  URINALYSIS, ROUTINE W REFLEX MICROSCOPIC - Abnormal; Notable for the following components:   Color, Urine STRAW (*)    Leukocytes,Ua TRACE (*)    All other components within normal limits  LIPASE, BLOOD    EKG None  Radiology CT ABDOMEN PELVIS W CONTRAST  Result Date: 03/23/2020 CLINICAL DATA:  Right lower quadrant abdominal pain and diarrhea. History of gastric bypass. EXAM: CT ABDOMEN AND PELVIS WITH CONTRAST TECHNIQUE: Multidetector CT imaging of the abdomen and pelvis was performed using the standard protocol following bolus administration of  intravenous contrast. CONTRAST:  121mL OMNIPAQUE IOHEXOL 300 MG/ML  SOLN COMPARISON:  None. FINDINGS: Lower chest: Normal Hepatobiliary: Normal Pancreas: Fatty change of the pancreas.  No focal lesion. Spleen: Normal Adrenals/Urinary Tract: Adrenal glands show normal appearance on the left and the presence of a fat-density mass on the right measuring up to 2 cm in size consistent with adenoma. The kidneys are normal. The bladder is distended but normal. Stomach/Bowel: Previous bariatric surgery. Small bowel appears normal otherwise. The appendix is not specifically identified. No inflammatory change seen in the right lower quadrant. The remainder of the colon is normal by CT. Vascular/Lymphatic: Normal Reproductive: Previous hysterectomy.  No pelvic mass. Other: No free fluid or air. Musculoskeletal: Degenerative change at L4-5 and L5-S1 which could be symptomatic. IMPRESSION: 1. No cause of right lower quadrant pain identified. The appendix is not specifically identified. No inflammatory change seen in the right lower quadrant. 2. Previous bariatric surgery. 3. 2 cm right adrenal adenoma. 4. Degenerative change at L4-5 and L5-S1 which could be symptomatic. Electronically Signed   By: Nelson Chimes M.D.   On: 03/23/2020 15:20    Procedures Procedures (including critical care time)  Medications Ordered in ED Medications  fentaNYL (SUBLIMAZE) injection 100 mcg (100 mcg Intravenous Given 03/23/20 1123)  ondansetron (ZOFRAN) injection 4 mg (4 mg Intravenous Given 03/23/20 1120)  morphine 4 MG/ML injection 6 mg (6 mg Intravenous Given 03/23/20 1422)  iohexol (OMNIPAQUE) 300 MG/ML solution 100 mL (100 mLs Intravenous Contrast Given 03/23/20 1514)    ED Course  I have reviewed the triage vital signs and the nursing notes.  Pertinent labs & imaging results that were available during my care of the patient were reviewed by me and considered in my medical decision making (see chart for details).  MDM  Rules/Calculators/A&P                           59 y.o. yo with chief complaint of RLQ and rip groin pain. +Nausea, foul smelling diarrhea and flatus.    Previous medical records available, triage and nursing notes reviewed to obtain more history and assist with MDM  Differential diagnosis: MSK strain, right sided diverticulitis, ureteral stone, UTI, appendicitis.  Considered GU process like ovarian torsion, PID, less likely.   ER lab work and imaging ordered by triage RN and me, as above  I have personally visualized and interpreted ER diagnostic work up including labs and imaging.    Labs reveal -normal WBC.  Stable anemia hemoglobin 10.7.  Normal LFTs, creatinine, lipase.  Urinalysis without signs of infection.  Imaging reveals -CT A/P without acute findings  Medications ordered -fentanyl, morphine, Zofran  Ordered continuous cardiac and pulse ox monitoring.  Will plan for serial re-examinations. Close monitoring.   Re-evaluated the patient.   Reports improvement in pain.  Discussed lab work and CT findings.  Overall, clinical presentation and ER work up most suggestive of viral gastroenteritis/diarrhea versus muscular strain as pain began while patient was walking.  Recommended muscle relaxer, home pain medicine, Imodium as needed for diarrhea.  Return precautions discussed.  Patient is comfortable with this plan.  Final Clinical Impression(s) / ED Diagnoses Final diagnoses:  Right groin pain    Rx / DC Orders ED Discharge Orders         Ordered    cyclobenzaprine (FLEXERIL) 10 MG tablet  3 times daily        03/23/20 1635           Liberty Handy, [place] 03/23/20 1703    Pricilla Loveless, MD 03/25/20 [phone]

## 2020-03-23 NOTE — ED Triage Notes (Signed)
Pt reports right groin pain. Pt reports last Wednesday she was christmas shopping when she had a sharp pain on the right side of the groin. Pt denies any abd pain, Pt denies any other symptoms. Pt denies any abscesses. Pt denies any injury to the area.

## 2020-03-23 NOTE — Discharge Instructions (Addendum)
You were seen in the ER for evaluation of right lower abdominal pain and groin pain, diarrhea   Normal labs, CT scan  At this time the cause of your pain is unclear, it may be from muscular spasm or strain. It could also be from gut spasms from diarrhea  Take your usual pain medicines. Take flexeril 10 mg every 8 hours.  You may take imodium for diarrhea as needed.   Return for nausea, vomiting, worsening constant severe pain despite pain medicines, worsening bloody diarrhea, urinary symptoms

## 2020-03-23 NOTE — Progress Notes (Signed)
  Evaluation after Contrast Extravasation  Patient seen and examined immediately after contrast extravasation while in [facility].  Exam: There is moderate swelling at the right upper arm area.  There is no erythema. There is no discoloration. There are no blisters. There are no signs of decreased perfusion of the skin.  It is warm to touch.  The patient has normal ROM in fingers.  Radial pulse is normal.  Per contrast extravasation protocol, I have instructed the patient to keep an ice pack on the area for 20-60 minutes at a time for about 48 hours.   Keep arm elevated as much as possible.   The patient understands to call the radiology department if there is: - increase in pain or swelling - changed or altered sensation - ulceration or blistering - increasing redness - warmth or increasing firmness - decreased tissue perfusion as noted by decreased capillary refill or discoloration of skin - decreased pulses peripheral to site   Mickie Kay, NP 03/23/2020 2:16 PM

## 2020-08-26 ENCOUNTER — Ambulatory Visit: Payer: Medicare HMO | Admitting: Neurology

## 2020-08-26 ENCOUNTER — Encounter: Payer: Self-pay | Admitting: Neurology

## 2020-08-26 VITALS — BP 156/85 | HR 90 | Ht 64.0 in | Wt >= 6400 oz

## 2020-08-26 DIAGNOSIS — M79642 Pain in left hand: Secondary | ICD-10-CM

## 2020-08-26 DIAGNOSIS — M79641 Pain in right hand: Secondary | ICD-10-CM

## 2020-08-26 NOTE — Patient Instructions (Addendum)
Carpal Tunnel Syndrome  Carpal tunnel syndrome is a condition that causes pain, weakness, and numbness in your hand and arm. Numbness is when you cannot feel an area in your body. The carpal tunnel is a narrow area that is on the palm side of your wrist. Repeated wrist motion or certain diseases may cause swelling in the tunnel. This swelling can pinch the main nerve in the wrist. This nerve is called the median nerve. What are the causes? This condition may be caused by:  Moving your hand and wrist over and over again while doing a task.  Injury to the wrist.  Arthritis.  A sac of fluid (cyst) or abnormal growth (tumor) in the carpal tunnel.  Fluid buildup during pregnancy.  Use of tools that vibrate. Sometimes the cause is not known. What increases the risk? The following factors may make you more likely to have this condition:  Having a job that makes you do these things: ? Move your hand over and over again. ? Work with tools that vibrate, such as drills or sanders.  Being a woman.  Having diabetes, obesity, thyroid problems, or kidney failure. What are the signs or symptoms? Symptoms of this condition include:  A tingling feeling in your fingers.  Tingling or loss of feeling in your hand.  Pain in your entire arm. This pain may get worse when you bend your wrist and elbow for a long time.  Pain in your wrist that goes up your arm to your shoulder.  Pain that goes down into your palm or fingers.  Weakness in your hands. You may find it hard to grab and hold items. You may feel worse at night. How is this treated? This condition may be treated with:  Lifestyle changes. You will be asked to stop or change the activity that caused your problem.  Doing exercises and activities that make bones, muscles, and tendons stronger (physical therapy).  Learning how to use your hand again (occupational therapy).  Medicines for pain and swelling. You may have injections  in your wrist.  A wrist splint or brace.  Surgery. Follow these instructions at home: If you have a splint or brace:  Wear the splint or brace as told by your doctor. Take it off only as told by your doctor.  Loosen the splint if your fingers: ? Tingle. ? Become numb. ? Turn cold and blue.  Keep the splint or brace clean.  If the splint or brace is not waterproof: ? Do not let it get wet. ? Cover it with a watertight covering when you take a bath or a shower. Managing pain, stiffness, and swelling If told, put ice on the painful area:  If you have a removable splint or brace, remove it as told by your doctor.  Put ice in a plastic bag.  Place a towel between your skin and the bag.  Leave the ice on for 20 minutes, 2-3 times per day. Do not fall asleep with the cold pack on your skin.  Take off the ice if your skin turns bright red. This is very important. If you cannot feel pain, heat, or cold, you have a greater risk of damage to the area. Move your fingers often to reduce stiffness and swelling.   General instructions  Take over-the-counter and prescription medicines only as told by your doctor.  Rest your wrist from any activity that may cause pain. If needed, talk with your boss at work about changes  that can help your wrist heal.  Do exercises as told by your doctor, physical therapist, or occupational therapist.  Keep all follow-up visits. Contact a doctor if:  You have new symptoms.  Medicine does not help your pain.  Your symptoms get worse. Get help right away if:  You have very bad numbness or tingling in your wrist or hand. Summary  Carpal tunnel syndrome is a condition that causes pain in your hand and arm.  It is often caused by repeated wrist motions.  Lifestyle changes and medicines are used to treat this problem. Surgery may help in very bad cases.  Follow your doctor's instructions about wearing a splint, resting your wrist, keeping  follow-up visits, and calling for help. This information is not intended to replace advice given to you by your health care provider. Make sure you discuss any questions you have with your health care provider. Document Revised: 07/25/2019 Document Reviewed: 07/25/2019 Elsevier Patient Education  2021 [place] is a test to check how well your muscles and nerves are working. This procedure includes the combined use of electromyogram (EMG) and nerve conduction study (NCS). EMG is used to look for muscular disorders. NCS, which is also called electroneurogram, measures how well your nerves are controlling your muscles. The procedures are usually done together to check if your muscles and nerves are healthy. If the results of the tests are abnormal, this may indicate disease or injury, such as a neuromuscular disease or peripheral nerve damage. Tell a health care provider about: Any allergies you have. All medicines you are taking, including vitamins, herbs, eye drops, creams, and over-the-counter medicines. Any problems you or family members have had with anesthetic medicines. Any blood disorders you have. Any surgeries you have had. Any medical conditions you have. If you have a pacemaker. Whether you are pregnant or may be pregnant. What are the risks? Generally, this is a safe procedure. However, problems may occur, including: Infection where the electrodes were inserted. Bleeding. What happens before the procedure? Medicines Ask your health care provider about: Changing or stopping your regular medicines. This is especially important if you are taking diabetes medicines or blood thinners. Taking medicines such as aspirin and ibuprofen. These medicines can thin your blood. Do not take these medicines unless your health care provider tells you to take them. Taking over-the-counter medicines, vitamins, herbs, and supplements. General  instructions Your health care provider may ask you to avoid: Beverages that have caffeine, such as coffee and tea. Any products that contain nicotine or tobacco. These products include cigarettes, e-cigarettes, and chewing tobacco. If you need help quitting, ask your health care provider. Do not use lotions or creams on the same day that you will be having the procedure. What happens during the procedure? For EMG Your health care provider will ask you to stay in a position so that he or she can access the muscle that will be studied. You may be standing, sitting, or lying down. You may be given a medicine that numbs the area (local anesthetic). A very thin needle that has an electrode will be inserted into your muscle. Another small electrode will be placed on your skin near the muscle. Your health care provider will ask you to continue to remain still. The electrodes will send a signal that tells about the electrical activity of your muscles. You may see this on a monitor or hear it in the room. After your muscles have been studied at rest,  your health care provider will ask you to contract or flex your muscles. The electrodes will send a signal that tells about the electrical activity of your muscles. Your health care provider will remove the electrodes and the electrode needles when the procedure is finished. The procedure may vary among health care providers and hospitals.   For NCS An electrode that records your nerve activity (recording electrode) will be placed on your skin by the muscle that is being studied. An electrode that is used as a reference (reference electrode) will be placed near the recording electrode. A paste or gel will be applied to your skin between the recording electrode and the reference electrode. Your nerve will be stimulated with a mild shock. Your health care provider will measure how much time it takes for your muscle to react. Your health care provider will  remove the electrodes and the gel when the procedure is finished. The procedure may vary among health care providers and hospitals.   What happens after the procedure? It is up to you to get the results of your procedure. Ask your health care provider, or the department that is doing the procedure, when your results will be ready. Your health care provider may: Give you medicines for any pain. Monitor the insertion sites to make sure that bleeding stops. Summary Electromyoneurogram is a test to check how well your muscles and nerves are working. If the results of the tests are abnormal, this may indicate disease or injury. This is a safe procedure. However, problems may occur, such as bleeding and infection. Your health care provider will do two tests to complete this procedure. One checks your muscles (EMG) and another checks your nerves (NCS). It is up to you to get the results of your procedure. Ask your health care provider, or the department that is doing the procedure, when your results will be ready. This information is not intended to replace advice given to you by your health care provider. Make sure you discuss any questions you have with your health care provider. Document Revised: 11/28/2017 Document Reviewed: 11/10/2017 Elsevier Patient Education  [place].

## 2020-08-26 NOTE — Progress Notes (Signed)
[name]    Provider:  Dr Jaynee Eagles Requesting Provider: [name], MD Primary Care Provider:  [name], MD  CC:  Hand pain  HPI:  Erika Bennett is a 60 y.o. female here as requested by [name], MD for bilateral paresthesias in the hands.  Past medical history sleep apnea, obesity, heart murmur, GERD, depression, arthritis.  She feels numbness at the finger tips and tingling all finger tips but started with digits 3-4 right hand 1-2 years ago and now is all fingers and both hands, started waking her up at night with palms and fingers in pain, aching, hurting, Would take about 30 minutes to resolved. 8 months ago started worsening waking up at night. She feels weakness right > left, also joint pain in the knuckles of digits 4-5 of the right hand. No neck pain, no shooting pain into the arms, she is having difficulty picking things up and dropping things, cant open jars. Nothng makes it better, hurts a lot in the morning, rubbing may help, no swelling or rashes in her hands. No other focal neurologic deficits, associated symptoms, inciting events or modifiable factors.  Reviewed notes, labs and imaging from outside physicians, which showed:  03/23/2010; CBC with anemia 10.7/34.8, cmp unremarkable  I reviewed Dr. 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  She is on chronic pain meds Norco and Flexeril for muscle spasms and alprazolam as needed for panic attacks.  Review of Systems: Patient complains of symptoms per HPI as well as the following symptoms: joint pain. Pertinent negatives and positives per HPI. All  others negative.   Social History   Socioeconomic History  . Marital status: Widowed    Spouse name: Not on file  . Number of children: 3  . Years of education: Not on file  . Highest education level: Not on file  Occupational History  . Not on file  Tobacco Use  . Smoking status: Never Smoker  . Smokeless tobacco: Never Used  Vaping Use  . Vaping Use: Never used  Substance and Sexual Activity  . Alcohol use: No    Comment: 2-3 drinks week  . Drug use: No  . Sexual activity: Not Currently  Other Topics Concern  . Not on file  Social History Narrative   Lives with her youngest daughter, son-in-law and 2 grandsons   Right handed   Caffeine: 1-2 cups/day   Social Determinants of Health   Financial Resource Strain: Not on file  Food Insecurity: Not on file  Transportation Needs: Not on file  Physical Activity: Not on file  Stress: Not on file  Social Connections: Not on file  Intimate Partner Violence: Not on file    Family History  Problem Relation Age of Onset  . Heart disease Father        heart attacks  . Other Sister        thyroid problem, was told of blood clots   . Drug abuse Brother        drug overdose   . Cancer Brother        prostate  . Heart disease Brother   .  Neuropathy Other   . Carpal tunnel syndrome Daughter     Past Medical History:  Diagnosis Date  . Abscess of perineum   . Anemia    states bleeding vaginally since 2/13  . Arthritis   . Blood transfusion    age 13 months  . Depression    controlled/ states death of husband 06/23/2009  . GERD (gastroesophageal reflux disease)   . Heart murmur   . Obesity   . Sleep apnea    last used CPAP 24-Jun-2002    Patient Active Problem List   Diagnosis Date Noted  . Spondylosis without myelopathy or radiculopathy, lumbar region 08/26/2016  . Depression with anxiety 06/27/2016  . Chronic pain of both knees 06/27/2016  . Chronic bilateral low back pain without sciatica 06/27/2016  . Calcific  Achilles tendinitis of right lower extremity 06/27/2016  . Annular tear of lumbar disc 05/19/2016  . Class 3 obesity due to excess calories with serious comorbidity and body mass index (BMI) greater than or equal to 70 in adult 05/19/2016  . DDD (degenerative disc disease), lumbar 05/19/2016  . Essential hypertension 02/01/2016  . Chronic pain syndrome 01/04/2016  . Hair loss 01/04/2016  . Sleep disturbance 01/04/2016  . Severe episode of recurrent major depressive disorder ([place]) 01/04/2016  . Weight gain 01/04/2016  . 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 Primary localized osteoarthrosis, lower leg 01/05/2012  . Uterine leiomyoma 07/25/2011  . Irregular menstrual cycle 07/18/2011    Past Surgical History:  Procedure Laterality Date  . ABDOMINAL HYSTERECTOMY  07/22/2011   Procedure: HYSTERECTOMY ABDOMINAL;  Surgeon: Lahoma Crocker, MD;  Location: [facility];  Service: Gynecology;;  . abscess peri rectal     I&d  . ACHILLES TENDON SURGERY Right   . DILATION AND CURETTAGE OF UTERUS    . GASTRIC BYPASS  06/23/01  . peri rectal    . TUBAL LIGATION      Current Outpatient Medications  Medication Sig Dispense Refill  . acetaminophen (TYLENOL) 500 MG tablet Take 1,000 mg by mouth 2 (two) times daily as needed (pain).    Marland Kitchen ALPRAZolam (XANAX) 0.5 MG tablet Take 0.5 mg by mouth daily as needed for anxiety.    Marland Kitchen amLODipine (NORVASC) 5 MG tablet Take 5 mg by mouth daily.    Marland Kitchen buPROPion (WELLBUTRIN SR) 150 MG 12 hr tablet Take 150 mg by mouth 2 (two) times daily.    Marland Kitchen gabapentin (NEURONTIN) 300 MG capsule Take 1 capsule (300 mg total) by mouth 3 (three) times daily. 15 capsule 0  . HYDROcodone-acetaminophen (NORCO) 10-325 MG tablet Take 1 tablet by mouth 3 (three) times daily.    . methocarbamol (ROBAXIN) 500 MG tablet Take 500 mg by mouth every morning.     No current facility-administered medications for this visit.    Allergies as of 08/26/2020 - Review Complete 08/26/2020  Allergen  Reaction Noted  . Duloxetine Other (See Comments) 11/21/2019    Vitals: BP (!) 156/85 (BP Location: Right Arm, Patient Position: Sitting, Cuff Size: Large)   Pulse 90   Ht 5\' 4"  (1.626 m)   Wt (!) 419 lb (190.1 kg)   LMP 07/17/2011   BMI 71.92 kg/m  Last Weight:  Wt Readings from Last 1 Encounters:  08/26/20 (!) 419 lb (190.1 kg)   Last Height:   Ht Readings from Last 1 Encounters:  08/26/20 5\' 4"  (1.626 m)     Physical exam: Exam: Gen: NAD, conversant, well nourised, morbidly obese, well groomed  CV: RRR, no MRG. No Carotid Bruits. No peripheral edema, warm, nontender Eyes: Conjunctivae clear without exudates or hemorrhage  Neuro: Detailed Neurologic Exam  Speech:    Speech is normal; fluent and spontaneous with normal comprehension.  Cognition:    The patient is oriented to person, place, and time;     recent and remote memory intact;     language fluent;     normal attention, concentration,     fund of knowledge Cranial Nerves:    The pupils are equal, round, and reactive to light. The fundi are normal and spontaneous venous pulsations are present. Visual fields are full to finger confrontation. Extraocular movements are intact. Trigeminal sensation is intact and the muscles of mastication are normal. The face is symmetric. The palate elevates in the midline. Hearing intact. Voice is normal. Shoulder shrug is normal. The tongue has normal motion without fasciculations.   Coordination:    Normal   Gait:    Wide based due to extremely large body habitus  Motor Observation:    No asymmetry, no atrophy, and no involuntary movements noted. Tone:    Normal muscle tone.    Posture:    Posture is normal. normal erect    Strength:    Strength is V/V in the upper and lower limbs.      Sensation: intact to LT     Reflex Exam:  DTR's:    Deep tendon reflexes in the upper and lower extremities are symmetrical bilaterally.   Toes:    The toes  are downgoing bilaterally.   Clonus:    Clonus is absent.    Assessment/Plan:  Patient with hand pain, may be CTS. No radicular symptoms but does endorse joint pain. Will perform emg/ncs. Discussed conservative options in the meantime, wrist splints as well.   Orders Placed This Encounter  Procedures  . NCV with EMG(electromyography)   No orders of the defined types were placed in this encounter.   Cc: [name], MD,  [name], MD  Sarina Ill, MD  [facility] [address] [place], [place] [address]  Phone [phone] Fax [phone]

## 2020-09-17 ENCOUNTER — Other Ambulatory Visit: Payer: Self-pay

## 2020-09-17 ENCOUNTER — Ambulatory Visit: Payer: Medicare HMO | Admitting: Neurology

## 2020-09-17 ENCOUNTER — Ambulatory Visit (INDEPENDENT_AMBULATORY_CARE_PROVIDER_SITE_OTHER): Payer: Medicare HMO | Admitting: Neurology

## 2020-09-17 DIAGNOSIS — M79641 Pain in right hand: Secondary | ICD-10-CM

## 2020-09-17 DIAGNOSIS — Z0289 Encounter for other administrative examinations: Secondary | ICD-10-CM

## 2020-09-17 DIAGNOSIS — M79642 Pain in left hand: Secondary | ICD-10-CM | POA: Diagnosis not present

## 2020-09-17 DIAGNOSIS — G5603 Carpal tunnel syndrome, bilateral upper limbs: Secondary | ICD-10-CM

## 2020-09-17 NOTE — Progress Notes (Signed)
See procedure note.

## 2020-09-17 NOTE — Progress Notes (Signed)
Full Name: Erika Bennett Gender: Female MRN #: [id number] Date of Birth: [birth year]    Visit Date: 09/17/2020 07:46 Age: 60 Years Examining Physician: Sarina Ill, MD  Requesting Provider: [name], MD Primary Care Provider:  [name], MD Height: 5 feet 4 inch Patient weight: 419lbs    History: Hand Pain  Summary: Nerve Conduction Studies were performed on the bilateral upper extremities.  The right median APB motor nerve showed no response The left  median APB motor nerve showed no response The right Median 2nd Digit orthodromic sensory nerve showed no response The left  Median 2nd Digit orthodromic sensory nerve showed no response The right median/ulnar (palm) comparison nerve showed prolonged distal peak latency  (Median Palm, 4.0 ms, N<2.2) and abnormal peak latency difference (Median Palm-Ulnar Palm, 2.2 ms, N<0.4) with a relative median delay.   The left median/ulnar (palm) comparison nerve showed prolonged distal peak latency (Median Palm, 3.5 ms, N<2.2) and abnormal peak latency difference (Median Palm-Ulnar Palm, 1.6 ms, N<0.4) with a relative median delay.   All remaining nerves (as indicated in the following tables) were within normal limits.   The right opponens pollicis muscle showed reduced recruitment. All remaining muscles (as indicated in the following tables) were within normal limits.    Conclusion: This is an abnormal study. There is electrophysiologic evidence of bilateral moderately-severe right > left Carpal Tunnel Syndrome.  No suggestion of polyneuropathy  or radiculopathy.    ------------------------------- Sarina Ill, M.D.  [facility] [address], [place], [place] [address] Tel: [phone] Fax: [phone]  Verbal informed consent was obtained from the patient, patient was informed of potential risk of procedure, including bruising, bleeding, hematoma formation, infection, muscle weakness,  muscle pain, numbness, among others.        Centre    Nerve / Sites Muscle Latency Ref. Amplitude Ref. Rel Amp Segments Distance Velocity Ref. Area    ms ms mV mV %  cm m/s m/s mVms  R Median - APB     Wrist APB NR ?4.4 NR ?4.0 NR Wrist - APB 7   NR     Upper arm APB NR  NR  NR Upper arm - Wrist 20 NR ?49 NR  L Median - APB     Wrist APB NR ?4.4 NR ?4.0 NR Wrist - APB 7   NR     Upper arm APB NR  NR  NR Upper arm - Wrist 20 NR ?49 NR  R Ulnar - ADM     Wrist ADM 2.5 ?3.3 6.7 ?6.0 100 Wrist - ADM 7   21.5     B.Elbow ADM 5.6  6.5  97.7 B.Elbow - Wrist 15 49 ?49 20.7     A.Elbow ADM 7.6  5.0  77.3 A.Elbow - B.Elbow 10 49 ?49 15.0           SNC    Nerve / Sites Rec. Site Peak Lat Ref.  Amp Ref. Segments Distance Peak Diff Ref.    ms ms V V  cm ms ms  R Median, Ulnar - Transcarpal comparison     Median Palm Wrist 4.0 ?2.2 3 ?35 Median Palm - Wrist 8       Ulnar Palm Wrist 1.8 ?2.2 15 ?12 Ulnar Palm - Wrist 8          Median Palm - Ulnar Palm  2.2 ?0.4  L Median, Ulnar - Transcarpal comparison  Median Palm Wrist 3.5 ?2.2 5 ?35 Median Palm - Wrist 8       Ulnar Palm Wrist 1.9 ?2.2 16 ?12 Ulnar Palm - Wrist 8          Median Palm - Ulnar Palm  1.6 ?0.4  R Median - Orthodromic (Dig II, Mid palm)     Dig II Wrist NR ?3.4 NR ?10 Dig II - Wrist 13    L Median - Orthodromic (Dig II, Mid palm)     Dig II Wrist NR ?3.4 NR ?10 Dig II - Wrist 13    R Ulnar - Orthodromic, (Dig V, Mid palm)     Dig V Wrist 2.5 ?3.1 6 ?5 Dig V - Wrist 21                 F  Wave    Nerve F Lat Ref.   ms ms  R Ulnar - ADM 27.4 ?32.0       EMG Summary Table    Spontaneous MUAP Recruitment  Muscle IA Fib PSW Fasc Other Amp Dur. Poly Pattern  R. Deltoid Normal None None None _______ Normal Normal Normal Normal  R. Triceps brachii Normal None None None _______ Normal Normal Normal Normal  R. Pronator teres Normal None None None _______ Normal Normal Normal Normal  R. First dorsal interosseous Normal None  None None _______ Normal Normal Normal Normal  R. Opponens pollicis Normal None None None _______ Normal Normal Normal Reduced

## 2020-09-17 NOTE — Procedures (Signed)
Full Name: Erika Bennett Gender: Female MRN #: [id number] Date of Birth: 07-14-1960    Visit Date: 09/17/2020 07:46 Age: 60 Years Examining Physician: Sarina Ill, MD  Requesting Provider: [name], MD Primary Care Provider:  [name], MD Height: 5 feet 4 inch Patient weight: 419lbs    History: Hand Pain  Summary: Nerve Conduction Studies were performed on the bilateral upper extremities.  The right median APB motor nerve showed no response The left  median APB motor nerve showed no response The right Median 2nd Digit orthodromic sensory nerve showed no response The left  Median 2nd Digit orthodromic sensory nerve showed no response The right median/ulnar (palm) comparison nerve showed prolonged distal peak latency  (Median Palm, 4.0 ms, N<2.2) and abnormal peak latency difference (Median Palm-Ulnar Palm, 2.2 ms, N<0.4) with a relative median delay.   The left median/ulnar (palm) comparison nerve showed prolonged distal peak latency (Median Palm, 3.5 ms, N<2.2) and abnormal peak latency difference (Median Palm-Ulnar Palm, 1.6 ms, N<0.4) with a relative median delay.   All remaining nerves (as indicated in the following tables) were within normal limits.   The right opponens pollicis muscle showed reduced recruitment. All remaining muscles (as indicated in the following tables) were within normal limits.    Conclusion: This is an abnormal study. There is electrophysiologic evidence of bilateral moderately-severe right > left Carpal Tunnel Syndrome.  No suggestion of polyneuropathy or radiculopathy.    ------------------------------- Sarina Ill, M.D.  [facility] [address] [place], [place] [address] Tel: [phone] Fax: [phone]  Verbal informed consent was obtained from the patient, patient was informed of potential risk of procedure, including bruising, bleeding, hematoma formation, infection, muscle weakness,  muscle pain, numbness, among others.        [place]    Nerve / Sites Muscle Latency Ref. Amplitude Ref. Rel Amp Segments Distance Velocity Ref. Area    ms ms mV mV %  cm m/s m/s mVms  R Median - APB     Wrist APB NR ?4.4 NR ?4.0 NR Wrist - APB 7   NR     Upper arm APB NR  NR  NR Upper arm - Wrist 20 NR ?49 NR  L Median - APB     Wrist APB NR ?4.4 NR ?4.0 NR Wrist - APB 7   NR     Upper arm APB NR  NR  NR Upper arm - Wrist 20 NR ?49 NR  R Ulnar - ADM     Wrist ADM 2.5 ?3.3 6.7 ?6.0 100 Wrist - ADM 7   21.5     B.Elbow ADM 5.6  6.5  97.7 B.Elbow - Wrist 15 49 ?49 20.7     A.Elbow ADM 7.6  5.0  77.3 A.Elbow - B.Elbow 10 49 ?49 15.0           SNC    Nerve / Sites Rec. Site Peak Lat Ref.  Amp Ref. Segments Distance Peak Diff Ref.    ms ms V V  cm ms ms  R Median, Ulnar - Transcarpal comparison     Median Palm Wrist 4.0 ?2.2 3 ?35 Median Palm - Wrist 8       Ulnar Palm Wrist 1.8 ?2.2 15 ?12 Ulnar Palm - Wrist 8          Median Palm - Ulnar Palm  2.2 ?0.4  L Median, Ulnar - Transcarpal comparison  Median Palm Wrist 3.5 ?2.2 5 ?35 Median Palm - Wrist 8       Ulnar Palm Wrist 1.9 ?2.2 16 ?12 Ulnar Palm - Wrist 8          Median Palm - Ulnar Palm  1.6 ?0.4  R Median - Orthodromic (Dig II, Mid palm)     Dig II Wrist NR ?3.4 NR ?10 Dig II - Wrist 13    L Median - Orthodromic (Dig II, Mid palm)     Dig II Wrist NR ?3.4 NR ?10 Dig II - Wrist 13    R Ulnar - Orthodromic, (Dig V, Mid palm)     Dig V Wrist 2.5 ?3.1 6 ?5 Dig V - Wrist 15                 F  Wave    Nerve F Lat Ref.   ms ms  R Ulnar - ADM 27.4 ?32.0       EMG Summary Table    Spontaneous MUAP Recruitment  Muscle IA Fib PSW Fasc Other Amp Dur. Poly Pattern  R. Deltoid Normal None None None _______ Normal Normal Normal Normal  R. Triceps brachii Normal None None None _______ Normal Normal Normal Normal  R. Pronator teres Normal None None None _______ Normal Normal Normal Normal  R. First dorsal interosseous Normal None  None None _______ Normal Normal Normal Normal  R. Opponens pollicis Normal None None None _______ Normal Normal Normal Reduced

## 2021-10-25 ENCOUNTER — Ambulatory Visit (INDEPENDENT_AMBULATORY_CARE_PROVIDER_SITE_OTHER): Payer: Self-pay | Admitting: Behavioral Health

## 2021-10-25 DIAGNOSIS — F489 Nonpsychotic mental disorder, unspecified: Secondary | ICD-10-CM

## 2021-10-25 NOTE — Progress Notes (Signed)
Patient did not show for scheduled appointment and did not provide 24 hr notice as required. Fee to be assessed.

## 2022-01-01 IMAGING — CT CT ABD-PELV W/ CM
2 of 5 series · 17 of 46 positions shown, 19 images · IV contrast (APPLIED)
Comparison: None.

CLINICAL DATA: Right lower quadrant abdominal pain and diarrhea.
History of gastric bypass.

EXAM:
CT ABDOMEN AND PELVIS WITH CONTRAST
TECHNIQUE: Multidetector CT imaging of the abdomen and pelvis was performed
using the standard protocol following bolus administration of
intravenous contrast.
CONTRAST:  100mL OMNIPAQUE IOHEXOL 300 MG/ML  SOLN

[Series 10: abdomen 5.0 · axial · 0.98mm/px · z∈[+798,+1213]mm · 14 of 97 slices shown, 16 images]
[im 7/97  soft-tissue]
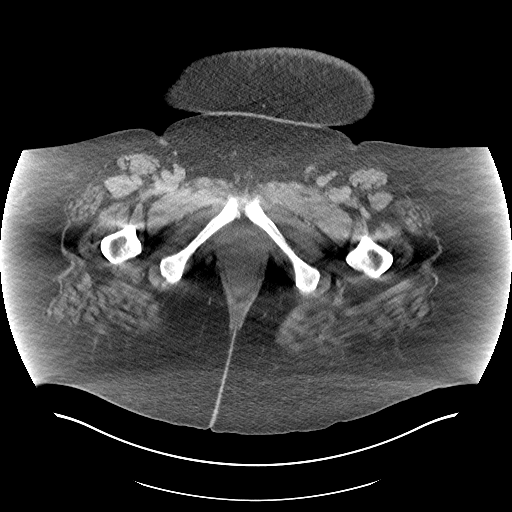
[im 7/97  bone]
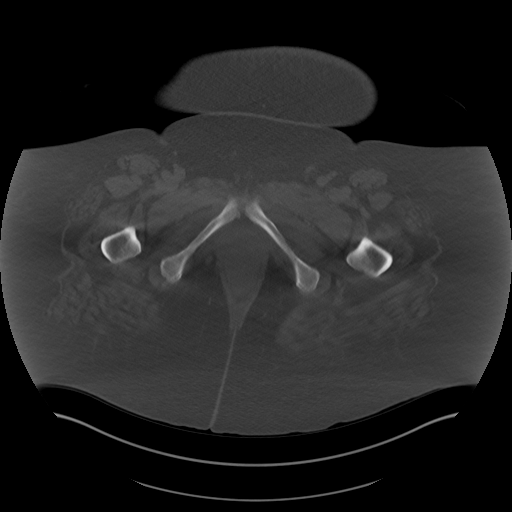
[im 13/97  soft-tissue]
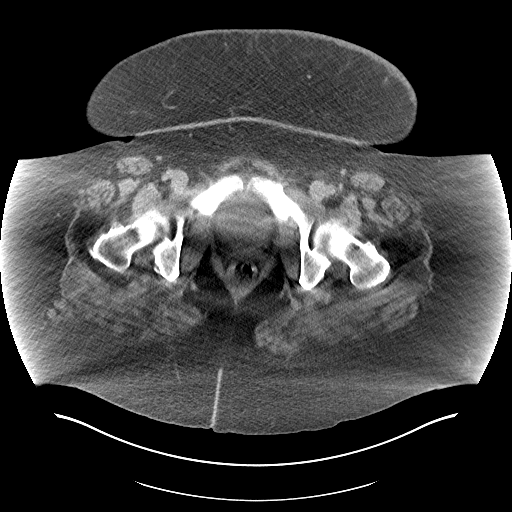
[im 20/97  soft-tissue]
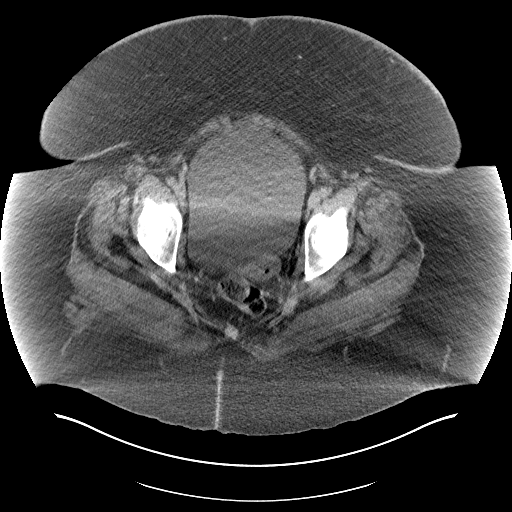
[im 26/97  soft-tissue]
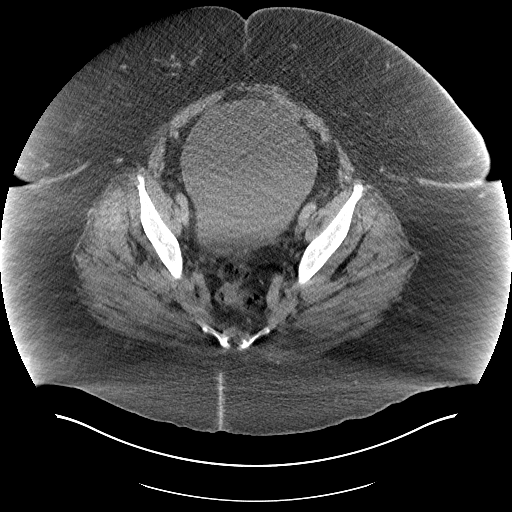
[im 33/97  soft-tissue]
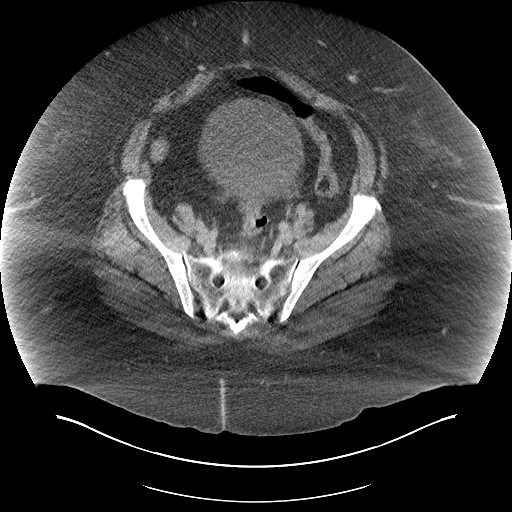
[im 39/97  soft-tissue]
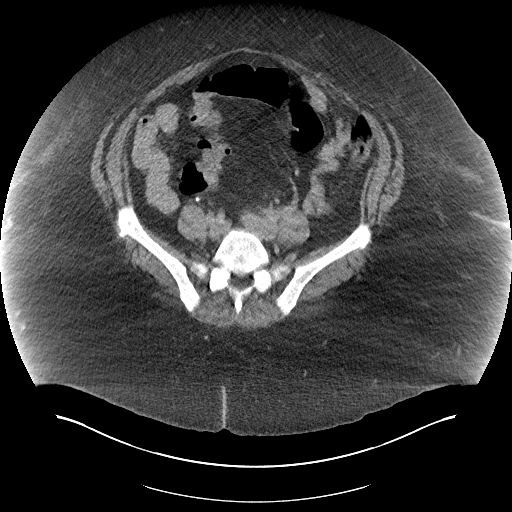
[im 45/97  soft-tissue]
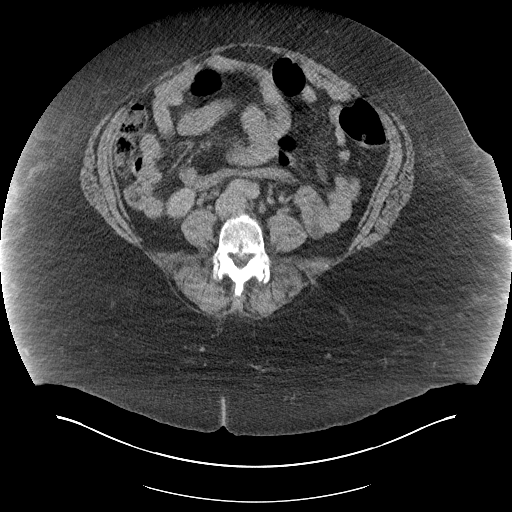
[im 52/97  soft-tissue]
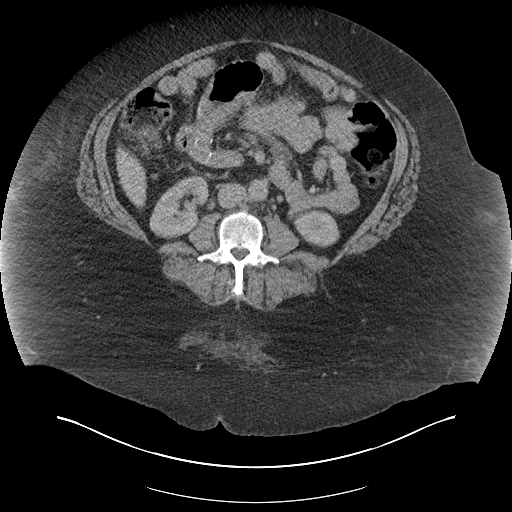
[im 58/97  soft-tissue]
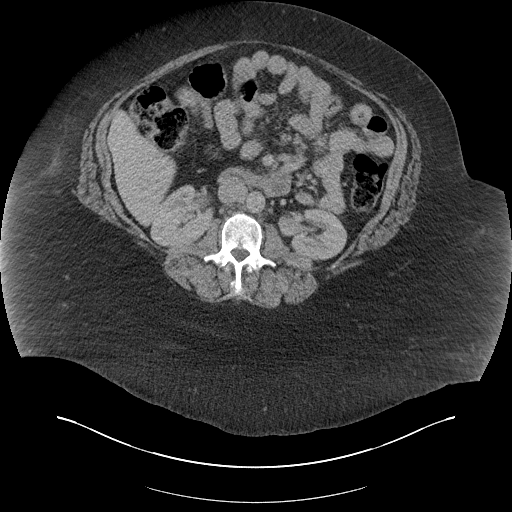
[im 58/97  bone]
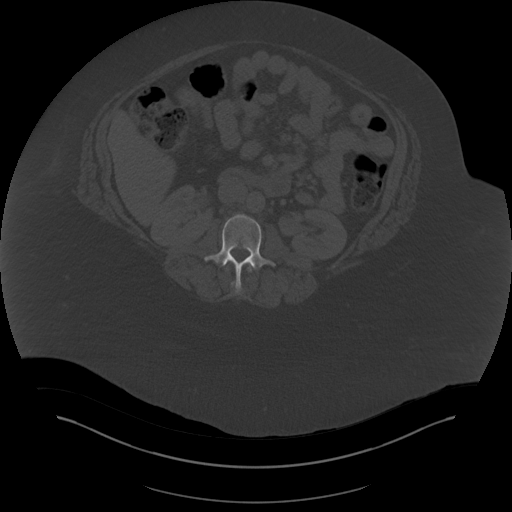
[im 65/97  soft-tissue]
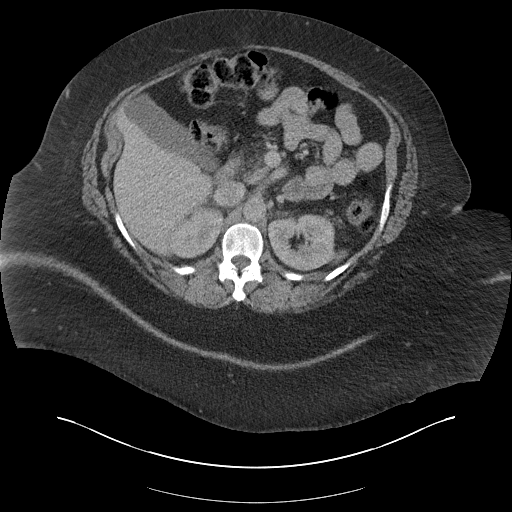
[im 71/97  soft-tissue]
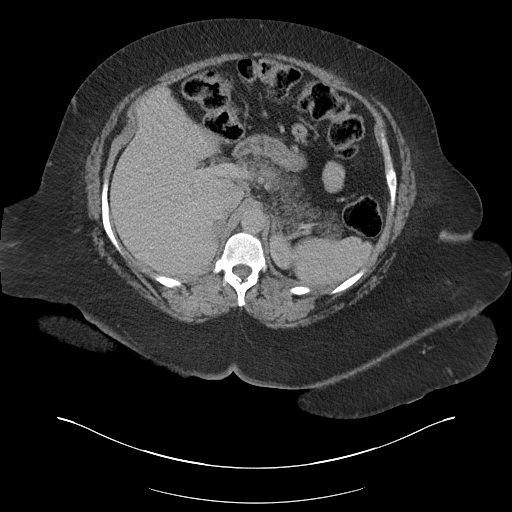
[im 77/97  soft-tissue]
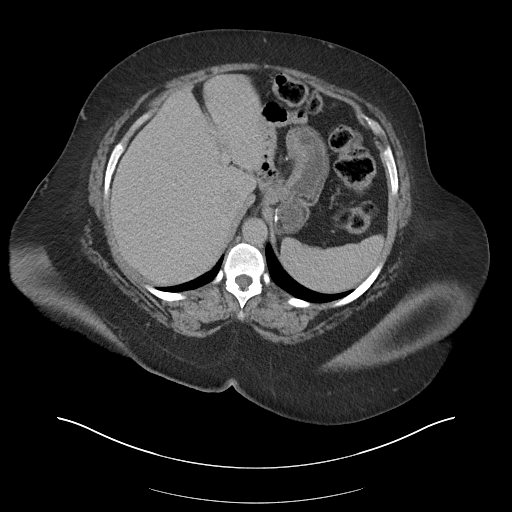
[im 84/97  soft-tissue]
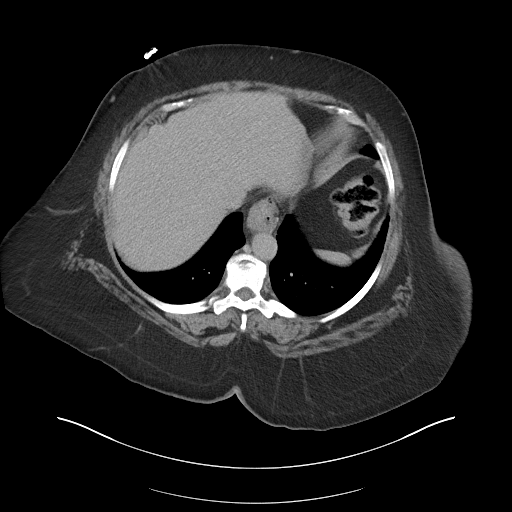
[im 90/97  soft-tissue]
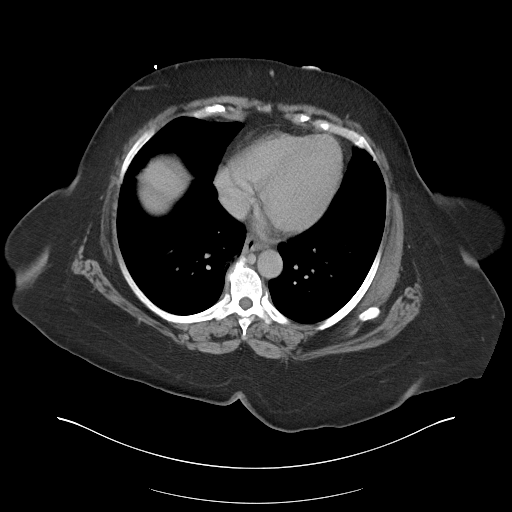

[Series 13: abdomen 3.0 mpr cor · coronal · 0.94mm/px · 3 of 142 slices shown]
[im 48/142  soft-tissue]
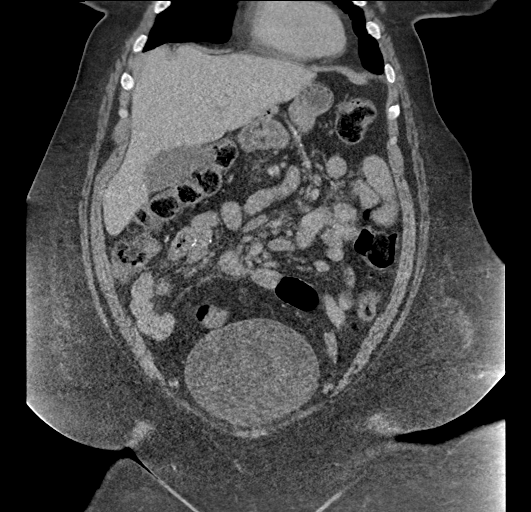
[im 63/142  soft-tissue]
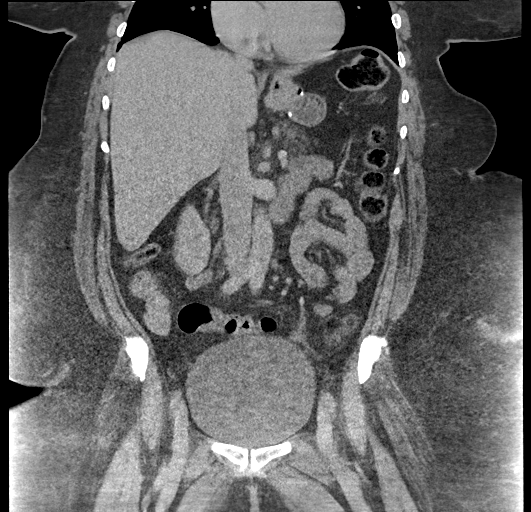
[im 79/142  soft-tissue]
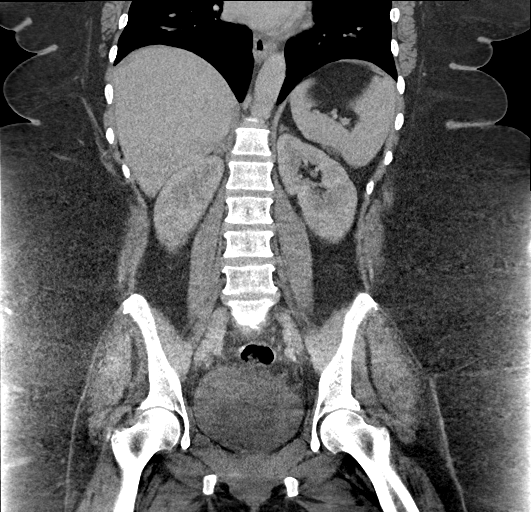

[17 of 46 positions shown; findings below may reference images not displayed]

FINDINGS: Lower chest: Normal

Hepatobiliary: Normal

Pancreas: Fatty change of the pancreas.  No focal lesion.

Spleen: Normal

Adrenals/Urinary Tract: Adrenal glands show normal appearance on the
left and the presence of a fat-density mass on the right measuring
up to 2 cm in size consistent with adenoma. The kidneys are normal.
The bladder is distended but normal.

Stomach/Bowel: Previous bariatric surgery. Small bowel appears
normal otherwise. The appendix is not specifically identified. No
inflammatory change seen in the right lower quadrant. The remainder
of the colon is normal by CT.

Vascular/Lymphatic: Normal

Reproductive: Previous hysterectomy.  No pelvic mass.

Other: No free fluid or air.

Musculoskeletal: Degenerative change at L4-5 and L5-S1 which could
be symptomatic.
IMPRESSION: 1. No cause of right lower quadrant pain identified. The appendix is
not specifically identified. No inflammatory change seen in the
right lower quadrant.
2. Previous bariatric surgery.
3. 2 cm right adrenal adenoma.
4. Degenerative change at L4-5 and L5-S1 which could be symptomatic.

## 2023-05-25 NOTE — Progress Notes (Signed)
 Patient did not connect for virtual psychiatric medication management appointment on 05/27/23 at 10AM. Sent secure video link with no response. Called phone with no answer; left VM with callback number to reschedule.  Daine Gip, MD 05/27/23

## 2023-05-27 ENCOUNTER — Encounter (HOSPITAL_COMMUNITY): Payer: Self-pay

## 2023-05-27 ENCOUNTER — Encounter (HOSPITAL_COMMUNITY): Payer: Self-pay | Admitting: Psychiatry

## 2023-06-07 ENCOUNTER — Ambulatory Visit (INDEPENDENT_AMBULATORY_CARE_PROVIDER_SITE_OTHER): Payer: Self-pay | Admitting: Licensed Clinical Social Worker

## 2023-06-07 DIAGNOSIS — F321 Major depressive disorder, single episode, moderate: Secondary | ICD-10-CM | POA: Diagnosis not present

## 2023-06-07 DIAGNOSIS — F331 Major depressive disorder, recurrent, moderate: Secondary | ICD-10-CM

## 2023-06-07 DIAGNOSIS — F50811 Binge eating disorder, moderate: Secondary | ICD-10-CM

## 2023-06-07 DIAGNOSIS — F4310 Post-traumatic stress disorder, unspecified: Secondary | ICD-10-CM

## 2023-06-10 ENCOUNTER — Ambulatory Visit (HOSPITAL_BASED_OUTPATIENT_CLINIC_OR_DEPARTMENT_OTHER): Admitting: Child and Adolescent Psychiatry

## 2023-06-10 DIAGNOSIS — F411 Generalized anxiety disorder: Secondary | ICD-10-CM | POA: Diagnosis not present

## 2023-06-10 DIAGNOSIS — F418 Other specified anxiety disorders: Secondary | ICD-10-CM | POA: Insufficient documentation

## 2023-06-10 DIAGNOSIS — F3341 Major depressive disorder, recurrent, in partial remission: Secondary | ICD-10-CM | POA: Insufficient documentation

## 2023-06-10 MED ORDER — TRAZODONE HCL 50 MG PO TABS: 25.0000 mg | ORAL_TABLET | Freq: Every evening | ORAL | 1 refills | Status: AC | PRN
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
## 2023-06-13 ENCOUNTER — Encounter (HOSPITAL_COMMUNITY): Payer: Self-pay | Admitting: Licensed Clinical Social Worker
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
## 2023-06-24 ENCOUNTER — Emergency Department (HOSPITAL_BASED_OUTPATIENT_CLINIC_OR_DEPARTMENT_OTHER)

## 2023-06-24 ENCOUNTER — Encounter (HOSPITAL_BASED_OUTPATIENT_CLINIC_OR_DEPARTMENT_OTHER): Payer: Self-pay | Admitting: Emergency Medicine

## 2023-06-24 ENCOUNTER — Emergency Department (HOSPITAL_BASED_OUTPATIENT_CLINIC_OR_DEPARTMENT_OTHER): Admitting: Radiology

## 2023-06-24 ENCOUNTER — Emergency Department (HOSPITAL_BASED_OUTPATIENT_CLINIC_OR_DEPARTMENT_OTHER)
Admission: EM | Admit: 2023-06-24 | Discharge: 2023-06-24 | Disposition: A | Discharge status: Home / Self Care | Attending: Emergency Medicine | Admitting: Emergency Medicine

## 2023-06-24 ENCOUNTER — Other Ambulatory Visit: Payer: Self-pay

## 2023-06-24 DIAGNOSIS — M1990 Unspecified osteoarthritis, unspecified site: Secondary | ICD-10-CM | POA: Diagnosis not present

## 2023-06-24 DIAGNOSIS — M25561 Pain in right knee: Secondary | ICD-10-CM | POA: Diagnosis present

## 2023-06-24 MED ORDER — HYDROCODONE-ACETAMINOPHEN 5-325 MG PO TABS
1.0000 | ORAL_TABLET | Freq: Once | ORAL | Status: AC
  Administered 2023-06-24: 1 via ORAL
  Filled 2023-06-24: qty 1

## 2023-06-24 NOTE — ED Provider Notes (Signed)
 [place] EMERGENCY DEPARTMENT AT [facility] Provider Note   CSN: [id number] Arrival date & time: 06/24/23  1137     History  Chief Complaint  Patient presents with   Leg Pain    Erika Bennett is a 63 y.o. 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  Patient states that she last had a steroid injection 6 weeks ago and sees [facility].  Patient denies any recent trauma to the area.  Patient is her knees do appear swollen and that she is concerned about a blood clot as she had a sister die from a blood clot when she had knee pain.  Patient denies history of blood clots, chest pain, shortness of breath.  Patient does use a cane to get around.   Home Medications Prior to Admission medications   Medication Sig Start Date End Date Taking? Authorizing Provider  acetaminophen (TYLENOL) 500 MG tablet Take 1,000 mg by mouth 2 (two) times daily as needed (pain).    [provider]  ALPRAZolam Prudy Feeler) 0.5 MG tablet Take 0.5 mg by mouth daily as needed for anxiety. 03/13/20   [provider]  amLODipine (NORVASC) 5 MG tablet Take 5 mg by mouth daily. 03/02/20   [provider]  buPROPion (WELLBUTRIN SR) 150 MG 12 hr tablet Take 150 mg by mouth 2 (two) times daily. 11/21/19   [provider]  cyanocobalamin (VITAMIN B12) 1000 MCG tablet Take 1,000 mcg by mouth daily.    [provider]  cyclobenzaprine (FLEXERIL) 5 MG tablet Take 5 mg by mouth 2 (two) times daily. 11/19/18   [provider]  HYDROcodone-acetaminophen (NORCO) 10-325 MG tablet Take 1 tablet by mouth 3 (three) times daily. 03/13/20   [provider]  Multiple Vitamin (MULTI-VITAMIN) tablet Take 1 tablet by mouth daily. 05/08/18   [provider]  Phentermine HCl 8 MG TABS Take 8 mg by mouth daily. 04/21/23   [provider]  topiramate (TOPAMAX) 50 MG tablet  Take 50 mg by mouth daily.    [provider]  traZODone (DESYREL) 50 MG tablet Take 0.5-1 tablets (25-50 mg total) by mouth at bedtime as needed for sleep. 06/10/23   Darcel Smalling, MD  venlafaxine XR (EFFEXOR-XR) 75 MG 24 hr capsule Take 75 mg by mouth daily with breakfast. 05/01/23   [provider]  Vitamin D, Ergocalciferol, (DRISDOL) 1.25 MG (50000 UNIT) CAPS capsule Take 50,000 Units by mouth 3 (three) times a week.    [provider]      Allergies    Duloxetine    Review of Systems   Review of Systems  Physical Exam Updated Vital Signs BP (!) 149/65   Pulse 90   Temp 98.4 F (36.9 C)   Resp 17   Ht 5\' 4"  (1.626 m)   Wt (!) 164.2 kg   LMP 07/15/2011   SpO2 99%   BMI 62.14 kg/m  Physical Exam Vitals reviewed.  Constitutional:      General: She is not in acute distress. Cardiovascular:     Rate and Rhythm: Normal rate.     Pulses: Normal pulses.  Musculoskeletal:     Comments: Bilateral knees: Tender throughout knee joint without bony abnormalities present, no obvious deformities or obvious swelling noted or skin color changes or warmth Pain not out of proportion Soft compartments  Skin:    General: Skin is warm and  dry.     Capillary Refill: Capillary refill takes less than 2 seconds.  Neurological:     Mental Status: She is alert.     Comments: Sensation intact distally  Psychiatric:        Mood and Affect: Mood normal.     ED Results / Procedures / Treatments   Labs (all labs ordered are listed, but only abnormal results are displayed) Labs Reviewed - No data to display  EKG None  Radiology US Venous Img Lower Bilateral (DVT) Result Date: 06/24/2023 CLINICAL DATA:  242300 Bilateral leg pain 242300 EXAM: BILATERAL LOWER EXTREMITY VENOUS DOPPLER ULTRASOUND TECHNIQUE: Gray-scale sonography with compression, as well as color and duplex ultrasound, were performed to evaluate the deep venous system(s) from the level of the common  femoral vein through the popliteal and proximal calf veins. COMPARISON:  None Available. FINDINGS: VENOUS Normal compressibility of the visualized common femoral, superficial femoral, and popliteal veins, as well as the visualized calf veins. Visualized portions of profunda femoral vein and great saphenous vein unremarkable. No filling defects to suggest DVT on grayscale or color Doppler imaging. Doppler waveforms show normal direction of venous flow, normal respiratory plasticity and response to augmentation. Limited visualizations of the distal femoral vein and posterior tibial veins bilaterally due to body habitus. Peroneal veins are not visualized bilaterally. OTHER Distal subcutaneous edema. Limitations: Patient body habitus IMPRESSION: Negative. Electronically Signed   By: Meda Klinefelter M.D.   On: 06/24/2023 13:24   DG Knee Complete 4 Views Right Result Date: 06/24/2023 CLINICAL DATA:  Right leg pain. EXAM: RIGHT KNEE - COMPLETE 4+ VIEW COMPARISON:  July 13, 2016. FINDINGS: No evidence of fracture, dislocation, or joint effusion. Severe narrowing of patellofemoral space is noted with osteophyte formation. Severe narrowing of medial joint space is also noted. Moderate narrowing and osteophyte formation of lateral joint space is noted. Soft tissues are unremarkable. IMPRESSION: Moderate to severe tricompartmental degenerative joint disease as noted above. No acute abnormality seen. Electronically Signed   By: Lupita Raider M.D.   On: 06/24/2023 12:51   DG Knee Complete 4 Views Left Result Date: 06/24/2023 CLINICAL DATA:  Leg pain EXAM: LEFT KNEE - COMPLETE 4+ VIEW COMPARISON:  11/12/2016 FINDINGS: Severe tricompartment osteoarthritis is noted. Most advanced within the medial compartment. Remodeling of the patella and large bony fragment noted adjacent to the inferior aspect of the patella. This appears unchanged from the prior examination and may represent changes of prior fracture with incomplete  healing or nonunion. No signs of acute fracture. IMPRESSION: 1. No acute findings. 2. Severe tricompartment osteoarthritis. 3. Large bony fragment adjacent to the inferior aspect of the patella appears unchanged from prior examination and may represent changes of prior fracture with nonunion. Electronically Signed   By: Signa Kell M.D.   On: 06/24/2023 12:50    Procedures Procedures    Medications Ordered in ED Medications  HYDROcodone-acetaminophen (NORCO/VICODIN) 5-325 MG per tablet 1 tablet (1 tablet Oral Given 06/24/23 1232)    ED Course/ Medical Decision Making/ A&P                                 Medical Decision Making Amount and/or Complexity of Data Reviewed Radiology: ordered.  Risk Prescription drug management.   Cyndra Numbers 63 y.o. presented today for bilateral knee pain. Working DDx that I considered at this time includes, but not limited to, arthritis, contusion, strain/sprain, fracture, dislocation, neurovascular  compromise, septic joint, ischemic limb, compartment syndrome.  R/o DDx: contusion, strain/sprain, fracture, dislocation, neurovascular compromise, septic joint, ischemic limb, compartment syndrome: These are considered less likely due to history of present illness, physical exam, labs/imaging findings.  Review of prior external notes: 06/20/2023 telephone  Unique Tests and My Independent Interpretation:  Right knee x-ray: Severe arthritis noted Left knee x-ray: Severe arthritis noted Bilateral DVT study: Unremarkable  Social Determinants of Health: none  Discussion with Independent Historian: None  Discussion of Management of Tests: None  Risk: Medium: prescription drug management  Risk Stratification Score: none  Plan: On exam patient was no acute distress with stable vitals.  Patient was neuro vastly intact and has reassuring exam but does have tenderness to both of her knees throughout.  Do suspect this is her arthritis flaring up however  patient states she is most concerned about a blood clot and so we will get ultrasound along with x-ray.  Patient given Norco for pain.  Patient's physical exam and imaging are reassuring.  At this time I do suspect patient's symptoms are from her arthritis and will have her follow-up with your orthopedist.  With I spoke to the patient about RICE therapy including Tylenol every 6 hours needed pain, ice 3-4 times daily for 15 minutes at a time, elevation of extremity, using a brace and to follow-up with their primary care provider.  Patient was given return precautions. Patient stable for discharge at this time.  Patient verbalized understanding of plan.  This chart was dictated using voice recognition software.  Despite best efforts to proofread,  errors can occur which can change the documentation meaning.         Final Clinical Impression(s) / ED Diagnoses Final diagnoses:  Arthritis    Rx / DC Orders ED Discharge Orders     None         Remi Deter 06/24/23 1433    Lorre Nick, MD 06/25/23 [phone]

## 2023-06-24 NOTE — Discharge Instructions (Signed)
 Please follow-up with your primary care provider in regards to recent ER visit. Today's physical exam and imaging was reassuring and your pain is most likely from your arthritis and you will need to follow-up with your orthopedist.  Please take your medications as prescribed. You may rest, ice, compress, elevate your extremity and use Tylenol every 6 hours as needed for pain. If you begin to have decreased sensation, skin color changes, pain out of proportion/not controlled by over-the-counter medications, rigid compartments, or other worsening of symptoms please return to ER.

## 2023-06-24 NOTE — ED Triage Notes (Signed)
 Pt has arthritis in her knees.had a cortisone injection  in her knee and that helped with her bilateral lower leg pain that was new.that was 6 weeks ago, the pain has returned to bilateral lower extremities, states feels tight all the time.swollen as well

## 2023-06-28 ENCOUNTER — Ambulatory Visit (HOSPITAL_COMMUNITY): Admitting: Licensed Clinical Social Worker

## 2023-07-26 ENCOUNTER — Encounter (HOSPITAL_COMMUNITY): Payer: Self-pay

## 2023-07-26 ENCOUNTER — Ambulatory Visit (INDEPENDENT_AMBULATORY_CARE_PROVIDER_SITE_OTHER): Admitting: Licensed Clinical Social Worker

## 2023-07-26 ENCOUNTER — Encounter (HOSPITAL_COMMUNITY): Payer: Self-pay | Admitting: Licensed Clinical Social Worker

## 2023-07-26 DIAGNOSIS — F331 Major depressive disorder, recurrent, moderate: Secondary | ICD-10-CM | POA: Diagnosis not present

## 2023-07-26 NOTE — Progress Notes (Signed)
 Virtual Visit via Video Note  I connected with Erika Bennett on 07/26/23 at 11:00 AM EDT by a video enabled telemedicine application and verified that I am speaking with the correct person using two identifiers.  Location: Patient: home Provider: home office   I discussed the limitations of evaluation and management by telemedicine and the availability of in person appointments. The patient expressed understanding and agreed to proceed.   I discussed the assessment and treatment plan with the patient. The patient was provided an opportunity to ask questions and all were answered. The patient agreed with the plan and demonstrated an understanding of the instructions.   The patient was advised to call back or seek an in-person evaluation if the symptoms worsen or if the condition fails to improve as anticipated.  I provided 45 minutes of non-face-to-face time during this encounter.   Seldon Dago, LCSW   THERAPIST PROGRESS NOTE  Session Time: 11:00am-11:45am  Participation Level: Active  Behavioral Response: NeatAlertDepressed  Type of Therapy: Individual Therapy  Treatment Goals addressed: LTG: Reduce frequency, intensity, and duration of depression symptoms so that daily functioning is improved (OP Depression) Disciplines:  Interdisciplinary, PROVIDER Expected end:  01/25/24 LTG: Increase coping skills to manage depression and improve ability to perform daily activities (OP Depression) Disciplines:  Interdisciplinary, PROVIDER Expected end:  01/25/24  ProgressTowards Goals: Progressing  Interventions: CBT  Summary: Erika Bennett is a 63 y.o. female who presents with MDD, recurrent, moderate  Suicidal/Homicidal: Nowithout intent/plan  Therapist Response: Safira engaged well in individual virtual session with clinician.  Clinician utilized CBT to build rapport and explore thoughts feelings and behaviors since last session.  Clinician identified some ongoing depressed  mood, due to grief and compounded loss.  Clinician discussed coping skills and encouraged Erika Bennett to release the tears and feelings of sadness.  Clinician provided time and space for Erika Bennett to share stories about her losses.  Clinician also reflected this sense of unfinished business with certain family members.  Clinician explored ways for Erika Bennett to find closure, even when the closure may not come from someone else.  Plan: Return again in 2 weeks.  Diagnosis: MDD (major depressive disorder), recurrent episode, moderate (HCC)  Collaboration of Care: Psychiatrist AEB reviewed note from last appt with Dr. Mee Spillers  Patient/Guardian was advised Release of Information must be obtained prior to any record release in order to collaborate their care with an outside provider. Patient/Guardian was advised if they have not already done so to contact the registration department to sign all necessary forms in order for us  to release information regarding their care.   Consent: Patient/Guardian gives verbal consent for treatment and assignment of benefits for services provided during this visit. Patient/Guardian expressed understanding and agreed to proceed.   Erika Stare [place], LCSW 07/26/2023

## 2023-08-23 ENCOUNTER — Encounter (HOSPITAL_COMMUNITY): Payer: Self-pay | Admitting: Licensed Clinical Social Worker

## 2023-08-23 ENCOUNTER — Ambulatory Visit (INDEPENDENT_AMBULATORY_CARE_PROVIDER_SITE_OTHER): Admitting: Licensed Clinical Social Worker

## 2023-08-23 DIAGNOSIS — F331 Major depressive disorder, recurrent, moderate: Secondary | ICD-10-CM

## 2023-08-23 NOTE — Progress Notes (Signed)
 Virtual Visit via Video Note  I connected with Erika Bennett on 08/23/23 at 11:00 AM EDT by a video enabled telemedicine application and verified that I am speaking with the correct person using two identifiers.  Location: Patient: home Provider: home office   I discussed the limitations of evaluation and management by telemedicine and the availability of in person appointments. The patient expressed understanding and agreed to proceed.   I discussed the assessment and treatment plan with the patient. The patient was provided an opportunity to ask questions and all were answered. The patient agreed with the plan and demonstrated an understanding of the instructions.   The patient was advised to call back or seek an in-person evaluation if the symptoms worsen or if the condition fails to improve as anticipated.  I provided 17 minutes of non-face-to-face time during this encounter.   Seldon Dago, LCSW   THERAPIST PROGRESS NOTE  Session Time: 11:00am-11:17am  Participation Level: Active  Behavioral Response: Well GroomedAlertAnxious and Depressed  Type of Therapy: Individual Therapy  Treatment Goals addressed: Treatment Goals addressed: LTG: Reduce frequency, intensity, and duration of depression symptoms so that daily functioning is improved (OP Depression) Disciplines:  Interdisciplinary, PROVIDER Expected end:  01/25/24 LTG: Increase coping skills to manage depression and improve ability to perform daily activities (OP Depression) Disciplines:  Interdisciplinary, PROVIDER Expected end:  01/25/24  ProgressTowards Goals: Initial  Interventions: Supportive  Summary: Erika Bennett is a 62 y.o. female who presents with MDD, recurrent, moderate.   Suicidal/Homicidal: Nowithout intent/plan  Therapist Response: Meegan engaged well in individual virtual session with Facilities manager. Clinician utilized Supportive counseling to process daughter's recent dx with breast cancer.  Clinician provided time and space for Jory to share thoughts and feelings. Clinician identified the importance of self-care throughout this process and explored coping skills. Clinician also noted the improvement in pain due to recent injections in legs. Lula shared she is ready to be supportive of her daughter and feels strongly about her involvement in appointments and treatment.   Session ended early due to Osha needing to take daughter to appt for cancer diagnosis.   Plan: Return again in 2-3 weeks.  Diagnosis: MDD (major depressive disorder), recurrent episode, moderate (HCC)  Collaboration of Care: Patient refused AEB none required at this time.  Patient/Guardian was advised Release of Information must be obtained prior to any record release in order to collaborate their care with an outside provider. Patient/Guardian was advised if they have not already done so to contact the registration department to sign all necessary forms in order for us  to release information regarding their care.   Consent: Patient/Guardian gives verbal consent for treatment and assignment of benefits for services provided during this visit. Patient/Guardian expressed understanding and agreed to proceed.   Merleen Stare [place], LCSW 08/23/2023

## 2023-09-14 ENCOUNTER — Ambulatory Visit (INDEPENDENT_AMBULATORY_CARE_PROVIDER_SITE_OTHER): Admitting: Licensed Clinical Social Worker

## 2023-09-14 DIAGNOSIS — F331 Major depressive disorder, recurrent, moderate: Secondary | ICD-10-CM

## 2023-09-24 ENCOUNTER — Encounter (HOSPITAL_COMMUNITY): Payer: Self-pay | Admitting: Licensed Clinical Social Worker

## 2023-09-24 NOTE — Progress Notes (Signed)
 Virtual Visit via Video Note  I connected with Arland CHRISTELLA Gull on 09/14/23 at 10:00 AM EDT by a video enabled telemedicine application and verified that I am speaking with the correct person using two identifiers.  Location: Patient: home Provider: home office   I discussed the limitations of evaluation and management by telemedicine and the availability of in person appointments. The patient expressed understanding and agreed to proceed.   I discussed the assessment and treatment plan with the patient. The patient was provided an opportunity to ask questions and all were answered. The patient agreed with the plan and demonstrated an understanding of the instructions.   The patient was advised to call back or seek an in-person evaluation if the symptoms worsen or if the condition fails to improve as anticipated.  I provided 45 minutes of non-face-to-face time during this encounter.   Harlene JONELLE Rosser, LCSW   THERAPIST PROGRESS NOTE  Session Time: 10:00am-10:45am  Participation Level: Active  Behavioral Response: Well GroomedAlertDepressed  Type of Therapy: Individual Therapy  Treatment Goals addressed: LTG: Reduce frequency, intensity, and duration of depression symptoms so that daily functioning is improved (OP Depression) Disciplines:  Interdisciplinary, PROVIDER Expected end:  01/25/24  LTG: Increase coping skills to manage depression and improve ability to perform daily activities (OP Depression) Disciplines:  Interdisciplinary, PROVIDER Expected end:  01/25/24  ProgressTowards Goals: Progressing  Interventions: Motivational Interviewing and Supportive  Summary: CORTNEE STEINMILLER is a 63 y.o. female who presents with MDD, recurrent, moderate.   Suicidal/Homicidal: Nowithout intent/plan  Therapist Response: Amrit engaged well in individual virtual session with Facilities manager. Clinician utilized MI OARS and Supportive counseling in order to process recent stressors. Clinician  reflected and summarized thoughts and feelings regarding her daughter's recent cancer dx. Clinician processed the fears, worries, and stress associated with this dx, as well as the trauma trigger due to other family members not surviving cancer. Clinician provided time and space for Keshana to share her feelings and encouraged her to let go of tears, anger, fear, and sadness. Clinician also noted that the other grandmother has moved into the home, which has been helpful for socialization and support. Clinician processed their relationship and noted that so far everything is going well.   Plan: Return again in 2 weeks.  Diagnosis: MDD (major depressive disorder), recurrent episode, moderate (HCC)  Collaboration of Care: Patient refused AEB none required  Patient/Guardian was advised Release of Information must be obtained prior to any record release in order to collaborate their care with an outside provider. Patient/Guardian was advised if they have not already done so to contact the registration department to sign all necessary forms in order for us  to release information regarding their care.   Consent: Patient/Guardian gives verbal consent for treatment and assignment of benefits for services provided during this visit. Patient/Guardian expressed understanding and agreed to proceed.   Harlene JONELLE [place], LCSW 09/24/2023

## 2023-10-26 ENCOUNTER — Encounter (HOSPITAL_COMMUNITY): Payer: Self-pay | Admitting: Licensed Clinical Social Worker

## 2023-10-26 ENCOUNTER — Ambulatory Visit (INDEPENDENT_AMBULATORY_CARE_PROVIDER_SITE_OTHER): Admitting: Licensed Clinical Social Worker

## 2023-10-26 DIAGNOSIS — F331 Major depressive disorder, recurrent, moderate: Secondary | ICD-10-CM

## 2023-10-26 NOTE — Progress Notes (Signed)
 Virtual Visit via Video Note  I connected with Arland CHRISTELLA Gull on 10/26/23 at 10:00 AM EDT by a video enabled telemedicine application and verified that I am speaking with the correct person using two identifiers.  Location: Patient: home Provider: home office   I discussed the limitations of evaluation and management by telemedicine and the availability of in person appointments. The patient expressed understanding and agreed to proceed.   I discussed the assessment and treatment plan with the patient. The patient was provided an opportunity to ask questions and all were answered. The patient agreed with the plan and demonstrated an understanding of the instructions.   The patient was advised to call back or seek an in-person evaluation if the symptoms worsen or if the condition fails to improve as anticipated.  I provided 20 minutes of non-face-to-face time during this encounter.   Harlene JONELLE Rosser, LCSW   THERAPIST PROGRESS NOTE  Session Time: 10:00am-10:20am  Participation Level: Active  Behavioral Response: NeatAlertEuthymic  Type of Therapy: Individual Therapy  Treatment Goals addressed:  LTG: Reduce frequency, intensity, and duration of depression symptoms so that daily functioning is improved (OP Depression) Disciplines:  Interdisciplinary, PROVIDER Expected end:  01/25/24   LTG: Increase coping skills to manage depression and improve ability to perform daily activities (OP Depression) Disciplines:  Interdisciplinary, PROVIDER Expected end:  01/25/24  ProgressTowards Goals: Progressing  Interventions: Supportive  Summary: VERETTA SABOURIN is a 63 y.o. female who presents with MDD, recurrent, moderate.   Suicidal/Homicidal: Nowithout intent/plan  Therapist Response: Amatullah engaged well in individual virtual session with Facilities manager. Clinician utilized Supportive therapy to discussed emotional/mental well being, family interactions, and current illness. Clinician  identified significant pain in lower back after a fall last week. Clinician explored communication with PCP and possible ED visit for scans. Clinician identified that at this time, Viktoriya is also feeling pain in abdomen, possibly gaul bladder or appendix. Clinician encouraged Kellyn to report these sxs to PCP as well in order to address any medical needs that may become emergent without intervention.  Clinician processed relationship with daughter and the support given while daughter copes with chemotherapy for cancer.  Overall Lenka shared she has been feeling okay and things seem stable. She reports that due to pain, she wanted to end session early.   Plan: Return again in 2 weeks.  Diagnosis: MDD (major depressive disorder), recurrent episode, moderate (HCC)  Collaboration of Care: Other provider involved in patient's care AEB encouraged Blue to contact her doctor or go to ED if pain persists or increases.  Patient/Guardian was advised Release of Information must be obtained prior to any record release in order to collaborate their care with an outside provider. Patient/Guardian was advised if they have not already done so to contact the registration department to sign all necessary forms in order for us  to release information regarding their care.   Consent: Patient/Guardian gives verbal consent for treatment and assignment of benefits for services provided during this visit. Patient/Guardian expressed understanding and agreed to proceed.   Harlene JONELLE [place], LCSW 10/26/2023

## 2023-11-09 ENCOUNTER — Encounter (HOSPITAL_COMMUNITY): Payer: Self-pay | Admitting: Licensed Clinical Social Worker

## 2023-11-09 ENCOUNTER — Ambulatory Visit (INDEPENDENT_AMBULATORY_CARE_PROVIDER_SITE_OTHER): Admitting: Licensed Clinical Social Worker

## 2023-11-09 DIAGNOSIS — F331 Major depressive disorder, recurrent, moderate: Secondary | ICD-10-CM

## 2023-11-09 NOTE — Progress Notes (Signed)
 Virtual Visit via Video Note  I connected with Arland CHRISTELLA Gull on 11/09/23 at 10:00 AM EDT by a video enabled telemedicine application and verified that I am speaking with the correct person using two identifiers.  Location: Patient: home Provider: home office   I discussed the limitations of evaluation and management by telemedicine and the availability of in person appointments. The patient expressed understanding and agreed to proceed.   I discussed the assessment and treatment plan with the patient. The patient was provided an opportunity to ask questions and all were answered. The patient agreed with the plan and demonstrated an understanding of the instructions.   The patient was advised to call back or seek an in-person evaluation if the symptoms worsen or if the condition fails to improve as anticipated.  I provided 40 minutes of non-face-to-face time during this encounter.   Harlene JONELLE Rosser, LCSW   THERAPIST PROGRESS NOTE  Session Time: 10:00am-10:40am  Participation Level: Active  Behavioral Response: NeatAlertDepressed  Type of Therapy: Individual Therapy  Treatment Goals addressed: LTG: Reduce frequency, intensity, and duration of depression symptoms so that daily functioning is improved (OP Depression) Disciplines:  Interdisciplinary, PROVIDER Expected end:  01/25/24   LTG: Increase coping skills to manage depression and improve ability to perform daily activities (OP Depression) Disciplines:  Interdisciplinary, PROVIDER Expected end:  01/25/24    ProgressTowards Goals: Not Progressing  Interventions: CBT  Summary: AMBERLEE GARVEY is a 63 y.o. female who presents with MDD, recurrent, moderate.   Suicidal/Homicidal: Nowithout intent/plan  Therapist Response: Ruba engaged well in individual virtual session with Facilities manager. Clinician utilized CBT to process thoughts, feelings, and behaviors. Clinician explored updates in Tanique's health, physically and mentally.  Clinician identified continual and untreated pain in her back that runs down her legs. Clinician explored coping and referrals to pain management. Clinician also processed concerns about daughter, who has cancer, and son, who also has some type of health problem that has not been discussed. Clinician explored Melodie's interest and ability to hear and cope with the health needs of her children. Oluwaseun shared that the less she knows, the more she worries. Clinician encouraged Tracia to speak up and communicate to her children about her ability to support and be present for them.   Plan: Return again in 2 weeks.  Diagnosis: MDD (major depressive disorder), recurrent episode, moderate (HCC)  Collaboration of Care: Medication Management AEB reviewed last med note and noted that combination of Wellbutrin and Effexor has been helpful in managing depressed mood and giving a little more energy.   Patient/Guardian was advised Release of Information must be obtained prior to any record release in order to collaborate their care with an outside provider. Patient/Guardian was advised if they have not already done so to contact the registration department to sign all necessary forms in order for us  to release information regarding their care.   Consent: Patient/Guardian gives verbal consent for treatment and assignment of benefits for services provided during this visit. Patient/Guardian expressed understanding and agreed to proceed.   Harlene JONELLE [place], LCSW 11/09/2023

## 2023-11-23 ENCOUNTER — Encounter (HOSPITAL_COMMUNITY): Payer: Self-pay | Admitting: Licensed Clinical Social Worker

## 2023-11-23 ENCOUNTER — Ambulatory Visit (HOSPITAL_COMMUNITY): Admitting: Licensed Clinical Social Worker

## 2023-11-23 DIAGNOSIS — F331 Major depressive disorder, recurrent, moderate: Secondary | ICD-10-CM

## 2023-11-23 NOTE — Progress Notes (Signed)
 Virtual Visit via Video Note  I connected with Erika Bennett on 11/23/23 at 10:00 AM EDT by a video enabled telemedicine application and verified that I am speaking with the correct person using two identifiers.  Location: Patient: home Provider: home office   I discussed the limitations of evaluation and management by telemedicine and the availability of in person appointments. The patient expressed understanding and agreed to proceed.   I discussed the assessment and treatment plan with the patient. The patient was provided an opportunity to ask questions and all were answered. The patient agreed with the plan and demonstrated an understanding of the instructions.   The patient was advised to call back or seek an in-person evaluation if the symptoms worsen or if the condition fails to improve as anticipated.  I provided 45 minutes of non-face-to-face time during this encounter.   Erika JONELLE Rosser, LCSW   THERAPIST PROGRESS NOTE  Session Time: 10:00am-10:45am  Participation Level: Active  Behavioral Response: NeatAlertIrritable  Type of Therapy: Individual Therapy  Treatment Goals addressed: LTG: Reduce frequency, intensity, and duration of depression symptoms so that daily functioning is improved (OP Depression) Disciplines:  Interdisciplinary, PROVIDER Expected end:  01/25/24   LTG: Increase coping skills to manage depression and improve ability to perform daily activities (OP Depression) Disciplines:  Interdisciplinary, PROVIDER Expected end:  01/25/24  ProgressTowards Goals: Progressing  Interventions: Motivational Interviewing  Summary: Erika Bennett is a 63 y.o. female who presents with MDD, recurrent, moderate.   Suicidal/Homicidal: Nowithout intent/plan  Therapist Response: Erika Bennett engaged well in individual virtual session with Facilities manager. Clinician utilized MI OARS to reflect and summarize thoughts, feelings, and interactions. Erika Bennett shared updates about health  and pain. Clinician explored Erika Bennett's experience with [facility] and noted that the appointment was no a good experience. Clinician provided time and space for Erika Bennett to share her experience and her frustration about how she was treated. Clinician noted the importance of communicating with PCP about this referral and her experience there. Clinician also discussed updates about her daughter's health and the plans for ongoing treatment of cancer. Clinician discussed coping skills and ways to ensure that emotional and physical boundaries are in place.  Plan: Return again in 2 weeks.  Diagnosis: MDD (major depressive disorder), recurrent episode, moderate (HCC)  Collaboration of Care: Primary Care Provider AEB encouraged reporting of pain management experience to PCP  Patient/Guardian was advised Release of Information must be obtained prior to any record release in order to collaborate their care with an outside provider. Patient/Guardian was advised if they have not already done so to contact the registration department to sign all necessary forms in order for us  to release information regarding their care.   Consent: Patient/Guardian gives verbal consent for treatment and assignment of benefits for services provided during this visit. Patient/Guardian expressed understanding and agreed to proceed.   Erika JONELLE [place], LCSW 11/23/2023

## 2023-12-13 ENCOUNTER — Ambulatory Visit (INDEPENDENT_AMBULATORY_CARE_PROVIDER_SITE_OTHER): Admitting: Licensed Clinical Social Worker

## 2023-12-13 ENCOUNTER — Encounter (HOSPITAL_COMMUNITY): Payer: Self-pay | Admitting: Licensed Clinical Social Worker

## 2023-12-13 DIAGNOSIS — F331 Major depressive disorder, recurrent, moderate: Secondary | ICD-10-CM | POA: Diagnosis not present

## 2023-12-13 NOTE — Progress Notes (Signed)
 Virtual Visit via Video Note  I connected with Arland CHRISTELLA Gull on 12/13/23 at  8:00 AM EDT by a video enabled telemedicine application and verified that I am speaking with the correct person using two identifiers.  Location: Patient: home Provider: home office   I discussed the limitations of evaluation and management by telemedicine and the availability of in person appointments. The patient expressed understanding and agreed to proceed.   I discussed the assessment and treatment plan with the patient. The patient was provided an opportunity to ask questions and all were answered. The patient agreed with the plan and demonstrated an understanding of the instructions.   The patient was advised to call back or seek an in-person evaluation if the symptoms worsen or if the condition fails to improve as anticipated.  I provided 40 minutes of non-face-to-face time during this encounter.   Harlene JONELLE Rosser, LCSW   THERAPIST PROGRESS NOTE  Session Time: 8:00am-8:40am  Participation Level: Active  Behavioral Response: Well GroomedAlertDepressed and worried  Type of Therapy: Individual Therapy  Treatment Goals addressed: LTG: Reduce frequency, intensity, and duration of depression symptoms so that daily functioning is improved (OP Depression) Disciplines:  Interdisciplinary, PROVIDER Expected end:  01/25/24   LTG: Increase coping skills to manage depression and improve ability to perform daily activities (OP Depression) Disciplines:  Interdisciplinary, PROVIDER Expected end:  01/25/24  ProgressTowards Goals: Progressing  Interventions: Motivational Interviewing  Summary: DENEANE STIFTER is a 63 y.o. female who presents with MDD, recurrent, moderate.   Suicidal/Homicidal: Nowithout intent/plan  Therapist Response: Traeh engaged well in individual virtual session with Facilities manager. Clinician utilized MI OARS to reflect and summarize thoughts, feelings, and interactions. Clinician  explored updates on Jenniefer's health, as well as her daughter's health. Kimarie shared ongoing concerns about incontinence and shared she will soon be seeing her doctor to address her concerns. Neeta shared concerns about daughter coping with cancer treatment, plans for a double mastectomy and later reconstruction. Clinician explored the relationship and openness between Amonie and daughter. Clinician also noted the role of worrying as women and mothers. Clinician identified signficant worry and a sense of helplessness, since she cannot do much related to the disease. However, clinician encouraged Charity to offer her listening ear and comfortable space to receive love and support. Clinician explored self care opportunities and encouraged Likisha to participate in something weekly to maintain her own positive attitude.   Plan: Return again in 2-3 weeks.  Diagnosis: MDD (major depressive disorder), recurrent episode, moderate (HCC)  Collaboration of Care: Patient refused AEB none required  Patient/Guardian was advised Release of Information must be obtained prior to any record release in order to collaborate their care with an outside provider. Patient/Guardian was advised if they have not already done so to contact the registration department to sign all necessary forms in order for us  to release information regarding their care.   Consent: Patient/Guardian gives verbal consent for treatment and assignment of benefits for services provided during this visit. Patient/Guardian expressed understanding and agreed to proceed.   Harlene JONELLE [place], LCSW 12/13/2023

## 2024-01-04 ENCOUNTER — Ambulatory Visit (INDEPENDENT_AMBULATORY_CARE_PROVIDER_SITE_OTHER): Admitting: Licensed Clinical Social Worker

## 2024-01-04 ENCOUNTER — Encounter (HOSPITAL_COMMUNITY): Payer: Self-pay | Admitting: Licensed Clinical Social Worker

## 2024-01-04 DIAGNOSIS — F3341 Major depressive disorder, recurrent, in partial remission: Secondary | ICD-10-CM | POA: Diagnosis not present

## 2024-01-04 NOTE — Progress Notes (Signed)
 Virtual Visit via Video Note  I connected with Erika Bennett on 01/04/24 at 10:00 AM EDT by a video enabled telemedicine application and verified that I am speaking with the correct person using two identifiers.  Location: Patient: home Provider: home office   I discussed the limitations of evaluation and management by telemedicine and the availability of in person appointments. The patient expressed understanding and agreed to proceed.   I discussed the assessment and treatment plan with the patient. The patient was provided an opportunity to ask questions and all were answered. The patient agreed with the plan and demonstrated an understanding of the instructions.   The patient was advised to call back or seek an in-person evaluation if the symptoms worsen or if the condition fails to improve as anticipated.  I provided 40 minutes of non-face-to-face time during this encounter.   Harlene JONELLE Rosser, LCSW   THERAPIST PROGRESS NOTE  Session Time: 10:00am-10:40am  Participation Level: Active  Behavioral Response: Well GroomedAlertEuthymic  Type of Therapy: Individual Therapy  Treatment Goals addressed:  LTG: Reduce frequency, intensity, and duration of depression symptoms so that daily functioning is improved (OP Depression) Disciplines:  Interdisciplinary, PROVIDER Expected end:  01/25/24   LTG: Increase coping skills to manage depression and improve ability to perform daily activities (OP Depression) Disciplines:  Interdisciplinary, PROVIDER Expected end:  01/25/24  ProgressTowards Goals: Progressing  Interventions: CBT  Summary: Erika Bennett is a 63 y.o. female who presents with MDD, in partial remission.   Suicidal/Homicidal: Nowithout intent/plan  Therapist Response: Genita engaged well in individual virtual session. Clinician utilized CBT to process thoughts, feelings, and behaviors. Clinician explored relationships within the household, noting that Aldean lives with  her daughter, son-in-law, children, son-in-law's mother, and son-in-law's grandfather. Clinician identified that there continues to be peace in the household and everyone is getting along great. Clinician processed physical health concerns and noted continuing frustration with incontinence following a fall several months ago. Otherwise, Aikam shared that she is feeling well overall and is looking to discharge from therapy possibly at the next session.   Plan: Return again in 4 weeks. Possible discharge from therapy.   Diagnosis: Recurrent major depressive disorder, in partial remission  Collaboration of Care: Patient refused AEB none required  Patient/Guardian was advised Release of Information must be obtained prior to any record release in order to collaborate their care with an outside provider. Patient/Guardian was advised if they have not already done so to contact the registration department to sign all necessary forms in order for us  to release information regarding their care.   Consent: Patient/Guardian gives verbal consent for treatment and assignment of benefits for services provided during this visit. Patient/Guardian expressed understanding and agreed to proceed.   Harlene JONELLE [place], LCSW 01/04/2024

## 2024-02-01 ENCOUNTER — Encounter (HOSPITAL_COMMUNITY): Payer: Self-pay | Admitting: Licensed Clinical Social Worker

## 2024-02-01 ENCOUNTER — Ambulatory Visit (INDEPENDENT_AMBULATORY_CARE_PROVIDER_SITE_OTHER): Admitting: Licensed Clinical Social Worker

## 2024-02-01 DIAGNOSIS — F3341 Major depressive disorder, recurrent, in partial remission: Secondary | ICD-10-CM | POA: Diagnosis not present

## 2024-02-01 NOTE — Progress Notes (Signed)
 Virtual Visit via Video Note  I connected with Erika Bennett on 02/01/24 at 10:00 AM EST by a video enabled telemedicine application and verified that I am speaking with the correct person using two identifiers.  Location: Patient: home Provider: home office   I discussed the limitations of evaluation and management by telemedicine and the availability of in person appointments. The patient expressed understanding and agreed to proceed.   I discussed the assessment and treatment plan with the patient. The patient was provided an opportunity to ask questions and all were answered. The patient agreed with the plan and demonstrated an understanding of the instructions.   The patient was advised to call back or seek an in-person evaluation if the symptoms worsen or if the condition fails to improve as anticipated.  I provided 40 minutes of non-face-to-face time during this encounter.   Harlene JONELLE Rosser, LCSW   THERAPIST PROGRESS NOTE  Session Time: 10:00am-10:40am  Participation Level: Active  Behavioral Response: NeatAlertAnxious and Depressed  Type of Therapy: Individual Therapy  Treatment Goals addressed:   LTG: Reduce frequency, intensity, and duration of depression symptoms so that daily functioning is improved (OP Depression) Disciplines:  Interdisciplinary, PROVIDER Expected end:  01/25/24   LTG: Increase coping skills to manage depression and improve ability to perform daily activities (OP Depression) Disciplines:  Interdisciplinary, PROVIDER Expected end:  01/25/24  ProgressTowards Goals: Progressing  Interventions: Solution Focused  Summary: BOSTYN KUNKLER is a 63 y.o. female who presents with MDD, recurrent, moderate.   Suicidal/Homicidal: Nowithout intent/plan  Therapist Response: Kaelea engaged well in individual virtual session with facilities manager. Clinician utilized Solution-Focused Therapy to process thoughts, feelings, and interactions. Clinician provided time for  Zamoria to share updates with her children, concerns about their physical and mental health, and recent visit to son in [place]. Clinician explored Akyla's coping with her children's needs. Clinician noted that Karianne hides in her room, laying down in bed and usually falling asleep. Clinician identified the challenges in that behavior since there are many things that need to get done. Clinician provided two true statements for Jenicka to think and say when she needs to do something: Perfect is the enemy of done and Might as well. Clinician provided feedback on how to use these statements for herself, noting that they take the stress out of the tasks.   Plan: Return again in 6 weeks.  Diagnosis: Recurrent major depressive disorder, in partial remission  Collaboration of Care: Patient refused AEB none required. Waiting for follow up appointments to figure out incontinence issues.  Patient/Guardian was advised Release of Information must be obtained prior to any record release in order to collaborate their care with an outside provider. Patient/Guardian was advised if they have not already done so to contact the registration department to sign all necessary forms in order for us  to release information regarding their care.   Consent: Patient/Guardian gives verbal consent for treatment and assignment of benefits for services provided during this visit. Patient/Guardian expressed understanding and agreed to proceed.   Harlene JONELLE [place], LCSW 02/01/2024

## 2024-03-14 ENCOUNTER — Encounter (HOSPITAL_COMMUNITY): Payer: Self-pay

## 2024-03-14 ENCOUNTER — Ambulatory Visit (HOSPITAL_COMMUNITY): Admitting: Licensed Clinical Social Worker
